# Patient Record
Sex: Female | Born: 1960 | Race: Black or African American | Hispanic: No | Marital: Married | Smoking: Never smoker
Health system: Southern US, Community
[De-identification: ages and names within clinical notes are randomized; demographics above are authoritative.]

## PROBLEM LIST (undated history)

## (undated) DIAGNOSIS — E7800 Pure hypercholesterolemia, unspecified: Secondary | ICD-10-CM

## (undated) DIAGNOSIS — F419 Anxiety disorder, unspecified: Secondary | ICD-10-CM

## (undated) DIAGNOSIS — N951 Menopausal and female climacteric states: Secondary | ICD-10-CM

## (undated) DIAGNOSIS — G43909 Migraine, unspecified, not intractable, without status migrainosus: Secondary | ICD-10-CM

## (undated) DIAGNOSIS — Z8742 Personal history of other diseases of the female genital tract: Secondary | ICD-10-CM

## (undated) DIAGNOSIS — D219 Benign neoplasm of connective and other soft tissue, unspecified: Secondary | ICD-10-CM

## (undated) HISTORY — PX: ABDOMINAL HYSTERECTOMY: SHX81

## (undated) HISTORY — DX: Pure hypercholesterolemia, unspecified: E78.00

## (undated) HISTORY — PX: LIPOMA EXCISION: SHX5283

## (undated) HISTORY — DX: Migraine, unspecified, not intractable, without status migrainosus: G43.909

## (undated) HISTORY — DX: Anxiety disorder, unspecified: F41.9

## (undated) HISTORY — PX: OTHER SURGICAL HISTORY: SHX169

## (undated) HISTORY — DX: Benign neoplasm of connective and other soft tissue, unspecified: D21.9

## (undated) HISTORY — PX: REDUCTION MAMMAPLASTY: SUR839

## (undated) HISTORY — PX: UTERINE FIBROID SURGERY: SHX826

## (undated) HISTORY — PX: MYOMECTOMY: SHX85

## (undated) HISTORY — DX: Personal history of other diseases of the female genital tract: Z87.42

## (undated) HISTORY — DX: Menopausal and female climacteric states: N95.1

---

## 1999-06-16 ENCOUNTER — Other Ambulatory Visit: Admission: RE | Admit: 1999-06-16 | Discharge: 1999-06-16 | Payer: Self-pay | Admitting: Obstetrics and Gynecology

## 1999-08-28 ENCOUNTER — Inpatient Hospital Stay (HOSPITAL_COMMUNITY): Admission: RE | Admit: 1999-08-28 | Discharge: 1999-08-30 | Payer: Self-pay | Admitting: Obstetrics and Gynecology

## 1999-08-28 ENCOUNTER — Encounter (INDEPENDENT_AMBULATORY_CARE_PROVIDER_SITE_OTHER): Payer: Self-pay

## 2000-06-14 ENCOUNTER — Other Ambulatory Visit: Admission: RE | Admit: 2000-06-14 | Discharge: 2000-06-14 | Payer: Self-pay | Admitting: *Deleted

## 2001-06-14 ENCOUNTER — Other Ambulatory Visit: Admission: RE | Admit: 2001-06-14 | Discharge: 2001-06-14 | Payer: Self-pay | Admitting: Obstetrics and Gynecology

## 2002-07-27 ENCOUNTER — Encounter: Payer: Self-pay | Admitting: Family Medicine

## 2002-07-27 ENCOUNTER — Ambulatory Visit (HOSPITAL_COMMUNITY): Admission: RE | Admit: 2002-07-27 | Discharge: 2002-07-27 | Payer: Self-pay | Admitting: Family Medicine

## 2002-08-09 ENCOUNTER — Other Ambulatory Visit: Admission: RE | Admit: 2002-08-09 | Discharge: 2002-08-09 | Payer: Self-pay | Admitting: *Deleted

## 2003-08-09 ENCOUNTER — Other Ambulatory Visit: Admission: RE | Admit: 2003-08-09 | Discharge: 2003-08-09 | Payer: Self-pay | Admitting: Obstetrics and Gynecology

## 2004-08-31 ENCOUNTER — Other Ambulatory Visit: Admission: RE | Admit: 2004-08-31 | Discharge: 2004-08-31 | Payer: Self-pay | Admitting: Obstetrics and Gynecology

## 2005-02-09 ENCOUNTER — Observation Stay (HOSPITAL_COMMUNITY): Admission: RE | Admit: 2005-02-09 | Discharge: 2005-02-10 | Payer: Self-pay | Admitting: Obstetrics and Gynecology

## 2005-02-09 ENCOUNTER — Encounter (INDEPENDENT_AMBULATORY_CARE_PROVIDER_SITE_OTHER): Payer: Self-pay | Admitting: *Deleted

## 2005-10-19 ENCOUNTER — Other Ambulatory Visit: Admission: RE | Admit: 2005-10-19 | Discharge: 2005-10-19 | Payer: Self-pay | Admitting: Obstetrics and Gynecology

## 2005-12-10 ENCOUNTER — Ambulatory Visit (HOSPITAL_COMMUNITY): Admission: RE | Admit: 2005-12-10 | Discharge: 2005-12-10 | Payer: Self-pay | Admitting: Family Medicine

## 2006-01-18 ENCOUNTER — Ambulatory Visit (HOSPITAL_COMMUNITY): Admission: RE | Admit: 2006-01-18 | Discharge: 2006-01-18 | Payer: Self-pay | Admitting: Obstetrics and Gynecology

## 2007-05-22 ENCOUNTER — Ambulatory Visit (HOSPITAL_COMMUNITY): Admission: RE | Admit: 2007-05-22 | Discharge: 2007-05-22 | Payer: Self-pay | Admitting: Obstetrics and Gynecology

## 2010-06-16 ENCOUNTER — Ambulatory Visit (HOSPITAL_COMMUNITY): Admission: RE | Admit: 2010-06-16 | Discharge: 2010-06-16 | Payer: Self-pay | Admitting: Obstetrics and Gynecology

## 2010-09-12 ENCOUNTER — Encounter: Payer: Self-pay | Admitting: Obstetrics and Gynecology

## 2011-01-08 NOTE — H&P (Signed)
[facility]  Patient:    Madison, Reyes Visit Number: [id number] MRN: [id number]          Service Type: Attending:  Janine Limbo, M.D. Dictated by:   Janine Limbo, M.D. Adm. Date:  02/26/02                           History and Physical  HISTORY OF PRESENT ILLNESS:   Ms. Madison Reyes is a 50 year old female, para I, 0-0-1, who presents for laparoscopically assisted vaginal hysterectomy.  The patient has a known history of fibroids.  She also has menorrhagia and dysmenorrhea.  The patient had an abdominal myomectomy in 2001.  She has been treated with pain medications and hormonal therapy.  They have not relieved her discomfort.  At this point she is ready to proceed with definitive therapy.  Her most recent Pap smear was within normal limits.  An endometrial biopsy was benign.  The patient had an ultrasound performed which showed again multiple fibroids.  PAST OBSTETRICAL HISTORY:     The patient has had one vaginal delivery at term.  PAST MEDICAL HISTORY:         The patient has had an anxiety disorder in the past.  MEDICATIONS:                  She takes Allegra for seasonal allergies.  DRUG ALLERGIES:               ASPIRIN irritates the patients stomach.  SOCIAL HISTORY:               The patient denies cigarette use, alcohol use, and recreational drug use.  REVIEW OF SYSTEMS:            See History of Present Illness.  FAMILY HISTORY:               The patient has a history of fibroids, as mentioned above.  The patients mother had a thyroidectomy.  Her father has had a heart attack.  PHYSICAL EXAMINATION:  VITAL SIGNS:                  Stable.  HEENT:                        Within normal limits.  CHEST:                        Clear.  HEART:                        Regular rate and rhythm.  BREAST:                       Without masses.  ABDOMEN:                      Nontender.  EXTREMITIES:                  Within normal  limits.  NEUROLOGIC:                   Grossly normal.  PELVIC:                       External genitalia normal.  Vagina normal. Cervix nontender.  Uterus is 8-10 week size and irregular.  Adnexa,  no masses. Rectovaginal examination confirms.  ASSESSMENT:                   1. Fibroid uterus.                               2. Prior myomectomy.                               3. Menorrhagia.                               4. Dysmenorrhea.  PLAN:                         The patient will proceed with laparoscopically assisted vaginal hysterectomy.  She understands the indications for her procedure and she accepts the risks of, but not limited to, anesthetic complications, bleeding, infection, and possible damage to the surrounding organs. Dictated by:   Janine Limbo, M.D. Attending:  Janine Limbo, M.D. DD:  02/25/02 TD:  02/26/02 Job: [id number]

## 2011-01-08 NOTE — Op Note (Signed)
NAMECARLEY, GLENDENNING               ACCOUNT NO.:  [id number]   MEDICAL RECORD NO.:  [id number]          PATIENT TYPE:  OBV   LOCATION:  9399                          FACILITY:  [facility]   PHYSICIAN:  Janine Limbo, M.D.DATE OF BIRTH:  Feb 05, 1961   DATE OF PROCEDURE:  02/09/2005  DATE OF DISCHARGE:                                 OPERATIVE REPORT   PREOPERATIVE DIAGNOSIS:  1.  Fibroid uterus.  2.  Dysmenorrhea.  3.  Menorrhagia.  4.  Dyspareunia.  5.  Status post myomectomy.  6.  Status post a right salpingo-oophorectomy.  7.  Vaginal cyst.   POSTOPERATIVE DIAGNOSIS:  1.  Fibroid uterus.  2.  Dysmenorrhea.  3.  Menorrhagia.  4.  Dyspareunia.  5.  Status post myomectomy.  6.  Status post a right salpingo-oophorectomy.  7.  Vaginal cyst.  8.  Pelvic adhesions.  9.  Endometriosis.   PROCEDURE:  1.  Diagnostic laparoscopy.  2.  Laparoscopic left salpingo-oophorectomy.  3.  Laparoscopic right salpingectomy.  4.  Peritoneal biopsies.  5.  Resection of endometriosis.  6.  Lysis of adhesions.  7.  Vaginal hysterectomy.  8.  Vaginal cystectomy.   SURGEON:  Janine Limbo, M.D.   FIRST ASSISTANT:  Elmira J. Adline Peals.   ANESTHETIC:  Is general.   DISPOSITION:  Ms. Knust is a 50 year old female, para 1-0-0-1, who presents  with the above-mentioned diagnosis. The patient has not had relief from her  discomfort from hormonal therapy nor nonsteroidal anti-inflammatory agents.  She is ready to proceed with surgery as mentioned above. She understands the  indications for surgical procedure and she accepts the risks of, but not  limited to, anesthetic complications, bleeding, infections, and possible  damage to surrounding organs. She understands that with the loss of her  ovaries that she may experience marked menopausal symptoms. We discussed  hormone replacement therapy as a possible treatment option. We reviewed the  risk and benefits of hormone replacement therapy and  we reviewed the  findings of the [facility]. The patient understands the  issues surrounding hormone-replacement therapy and she elects to proceed  with left oophorectomy (understanding that her right ovary has already been  removed) and that she wants to began estrogen replacement therapy.   FINDINGS:  The right ovary was surgically absent. The right fallopian tube  was only slightly edematous. The uterus was 10-week size and contained  multiple fibroids. The left fallopian tube was slightly shortened. The left  ovary appeared normal. There were adhesions present between the left ovary,  the left fallopian tube, and the posterior left uterus. There were adhesions  between the bowel and the left posterior uterus. There was a thick adhesion  between the omentum and the anterior abdominal wall. The anterior cul-de-sac  appeared normal. The posterior cul-de-sac appeared normal except for the  adhesions mentioned above. On the right anterior broad ligament, there were  two areas of hyperpigmentation and scarring. These areas were felt to be  consistent with endometriosis. The bowel otherwise appeared normal. Care was  taken throughout the operative procedure not to  damage the bowel, or the  vital structures underlying the pelvic sidewalls.   PROCEDURE:  The patient was taken to the operating room where a general  anesthetic was given. The patient's abdomen, perineum, and vagina were  prepped with multiple layers of Betadine. A Foley catheter was placed in the  bladder. A Hulka tenaculum was placed inside the uterus. The patient was  then sterilely draped. The subumbilical area was injected with 5 mL of half  percent Marcaine with epinephrine. A subumbilical incision was made and  carried sharply through the subcutaneous tissue, fascia, and the anterior  peritoneum. The Hassan cannula was sutured into place. A pneumoperitoneum  was then obtained. Two suprapubic areas were  injected with a total of 5 mL  of half percent Marcaine with epinephrine. Two small incisions were made and  two 5 mm trocars were placed into the lower abdomen under direct  visualization. Care was taken not to damage any of the vital underlying  structures. The pelvis and the abdomen were carefully inspected. Pictures  were taken of the patient's anatomy. The pelvic sidewalls were inspected and  the ureters were not felt to be in our operative areas. The adhesions were  lysed using a combination of blunt and sharp dissection. The adhesion  between the omentum and the anterior abdominal wall was first cauterized and  then it was cut. At this point we cauterized the left round ligament. The  ligament was cut. The left utero-ovarian ligament and left fallopian tube  were then cauterized and cut. The left infundibulopelvic ligament was  skeletonized. Care was taken not to damage any of the vital structures on  the left pelvic sidewall. We then used to Endoloop sutures of 0 Vicryl to  tied the left infundibulopelvic ligament. The ligament was cut and the ovary  and left fallopian tube were then placed in the posterior cul-de-sac. The  right fallopian tube was then cauterized and cut. The mesosalpinx on the  right was then cauterized and cut. The right fallopian tube was removed. As  noted previously the right ovary was surgically absent. There were two  hyperpigmented areas on the right broad ligament. The areas were then  resected using the laparoscopic scissors. Hemostasis was achieved using the  bipolar cautery. At this point we felt that we were ready to proceed with  the vaginal hysterectomy. Once the patient was then placed in a more  lithotomy position. The Hulka tenaculum was removed. The cervix was injected  with a diluted solution of Pitressin and saline. A circumferential incision  was made around the cervix and the vaginal mucosa was advanced both anteriorly and posteriorly. The  posterior cul-de-sac was entered.  Alternating from right to left the uterosacral ligaments, paracervical  tissues, parametrial tissues, and uterine arteries were clamped, cut,  sutured, and tied securely. The uterus was then inverted through the  posterior colpotomy. The remainder of the upper pedicles were then clamped  and cut. Hemostasis was achieved using figure-of-eight sutures. Care again  was taken not to damage any of the vital underlying structures. At this  point hemostasis was thought to be adequate. The sutures attached to the  uterosacral ligaments were brought out through the vaginal angles and tied  securely. The McCall culdoplasty suture was placed in the posterior cul-de-  sac incorporating the posterior peritoneum and both uterosacral ligaments. A  final check was made for hemostasis and again hemostasis was thought to be  adequate. The vaginal cuff was closed using figure-of-eight  sutures  incorporating the anterior vaginal mucosa, the anterior peritoneum,  posterior peritoneum, and the posterior vaginal mucosa. The McCall  culdoplasty suture was tied securely and the apex of the vagina was noted to  elevate into the mid pelvis. The patient tolerated this portion of her  procedure quite well. 0 Vicryl suture material used to this point. We then  injected the cyst on the left vaginal sidewall which measured approximately  3 cm in size. We injected the cyst with half percent Marcaine with  epinephrine. An incision was made in the vaginal mucosa overlying the cyst  and a combination of sharp and blunt dissection were used to dissect the  cyst from the left vaginal sidewall. Brisk bleeding was encountered.  Hemostasis was achieved using deep sutures of 0 Vicryl followed by  superficial sutures of 3-0 Vicryl. The vaginal mucosa was then closed using  a running locking suture of 3-0 Vicryl. Sponge, needle, and instrument  counts were correct at this point. The operator then  changed gown and  gloves. A pneumoperitoneum was then reestablished. The pelvis was carefully  inspected. The pelvis was irrigated. Hemostasis was confirmed. All  instruments were then removed under direct visualization. The fascia in the  subumbilical area was then closed using figure-of-eight sutures of 0 Vicryl.  The skin was closed using a subcuticular suture of 4-0 Monocryl. The  suprapubic incisions were closed using a subcuticular suture of 4-0  Monocryl. Sponge, needle, and instrument counts were correct on two  occasions. Estimated blood loss for the entire procedure was 210 mL. The  patient tolerated her procedure very well. She was awakened from her  anesthetic without difficulty. She was noted to drain clear yellow urine.  She was taken to the recovery room in stable condition.       AVS/MEDQ  D:  02/09/2005  T:  02/09/2005  Job:  [id number]

## 2011-01-08 NOTE — H&P (Signed)
Madison Reyes, Madison Reyes NO.:  [id number]   MEDICAL RECORD NO.:  [id number]          PATIENT TYPE:  AMB   LOCATION:  SDC                           [facility]ACILITY:  [facility]   PHYSICIAN:  Janine Limbo, M.D.DATE OF BIRTH:  June 19, 1961   DATE OF ADMISSION:  DATE OF DISCHARGE:                                HISTORY & PHYSICAL   HISTORY OF PRESENT ILLNESS:  Ms. Nault is a 50 year old female para 1-0-0-1  who presents for laparoscopy-assisted vaginal hysterectomy, left salpingo-  oophorectomy, and removal of a vaginal cyst. The patient has a known history  of fibroids. She had an abdominal myomectomy in 2001. At that time the  patient was noted to have filmy adhesions between the left fimbriated end of  fallopian tube and the left pelvic sidewall. The left ovary was slightly  adhered to the left posterior broad ligament. The right fallopian tube and  right ovary were noted to be surgically absent. The patient has had a prior  right salpingo-oophorectomy. The patient's most recent Pap smear was within  normal limits. She has had an endometrial biopsy that showed benign  elements. The the patient has been treated with oral contraceptives as well  as pain medication and she continues to have menorrhagia and dysmenorrhea.  The cyst in the vagina causes discomfort for her with intercourse. She does  complain of menopausal symptoms and she is currently taking Wellbutrin XL  300 milligrams. She also takes black cohosh, and other herbal medications.   OBSTETRICAL HISTORY:  The patient has had one term vaginal delivery.   PAST MEDICAL HISTORY:  The patient has a disorder that is similar to  anxiety. She has done better on Wellbutrin. Her other current medications  include multivitamins, calcium, black cohosh, B complex, vitamin D, flaxseed  oil, and Wellbutrin XL 300 milligrams.   SOCIAL HISTORY:  The patient denies cigarette use, alcohol use, and  recreational drug use.   DRUG  ALLERGIES:  ASPIRIN irritates the patient's stomach.   REVIEW OF SYSTEMS:  The patient complains of decreased libido in addition to  dyspareunia. Please see history of present illness.   FAMILY HISTORY:  The patient's mother had a thyroidectomy. The patient's  father has had a heart attack in the past.   PHYSICAL EXAMINATION:  VITAL SIGNS:  Weight is 151 pounds.  HEENT:  Within normal limits.  CHEST:  Clear.  HEART:  Regular rate and rhythm.  BREASTS:  Without masses.  ABDOMEN:  Soft and nontender.  EXTREMITIES:  Within normal limits.  NEUROLOGIC:  Grossly normal.  PELVIC:  External genitalia is normal. The vagina is normal except for a 3-4  cm cyst on the left vaginal side wall. Cervix is nontender and no lesions  are appreciated. The uterus is 10- to 12-week size and irregular. Adnexa  with no masses and rectovaginal exam confirms.   ASSESSMENT:  1.  Fibroid uterus.  2.  Dysmenorrhea.  3.  Menorrhagia.  4.  Vaginal cyst.  5.  Dyspareunia.  6.  Prior myomectomy  7.  Prior right salpingo-oophorectomy.   PLAN:  The patient  will undergo a laparoscopy-assisted vaginal hysterectomy  with left salpingo-oophorectomy. She will also have removal of the cyst in  the vagina. The patient understands the indications for her procedure as  well as the other treatment options. She accepts the risks of, but not  limited to, anesthetic complications, bleeding, infection, and possible  damage to surrounding organs. She understands that if she has her left ovary  removed, then hormone replacement therapy will be an issue for her. We have  discussed the risks and benefits of hormone replacement therapy and we have  also discussed the findings of the [facility]. After  carefully considering her options, the patient does wish to have her left  ovary removed and she wishes to begin estrogen replacement therapy.       AVS/MEDQ  D:  02/03/2005  T:  02/03/2005  Job:  [id number]

## 2011-01-08 NOTE — Discharge Summary (Signed)
[facility]  Patient:    Madison Reyes                       MRN: [id number] Adm. Date:  [id number] Disc. Date: [id number] Attending:  Leonard Schwartz Dictator:   Henreitta Leber, P.A.                           Discharge Summary  DISCHARGE DIAGNOSES:          1. Fibroid uterus.                               2. Menorrhagia.                               3. Dysmenorrhea.  PROCEDURE:                    On date of admission, the patient underwent an abdominal myomectomy.  HISTORY OF PRESENT ILLNESS:   This is a 50 year old African-American female, para 1-0-0-1, with a history of symptomatic uterine fibroids causing menorrhagia and  dysmenorrhea neither of which were relieved by oral contraceptives or NSAIDs. Please see the patients dictated history and physical examination for details.  PHYSICAL EXAMINATION:         VITAL SIGNS: Weight 167.  GENERAL: Within normal limits.  PELVIC: EGBUS was within normal limits.  Vagina is normal.  Cervix is nontender.  Uterus was 10 weeks size, irregular, and firm.  The adnexa reveal no masses.  Rectovaginal examination confirms.  HOSPITAL COURSE:              On date of admission, the patient underwent an abdominal myomectomy for the removal of multiple uterine fibroids and tolerated the procedure well.  Postoperative course was unremarkable.  Postoperative hemoglobin was 10.0, preoperative hemoglobin 12.5.  The patient quickly tolerated a regular diet and resumed bowel and bladder function by postoperative day #2 and was believed to have received the maximal benefit of her hospital stay and therefore was discharged to home.  DISCHARGE MEDICATIONS:        1. Vicodin one to two tablets every four hours as  needed for pain.                               2. Iron 325 mg one tablet three times a day for six                                  weeks.                               3. Also advised to continue  any previous medication.  FOLLOW-UP:                    The patient was advised to call [facility] to schedule an appointment within one week for staple  removal and also for a six-week postoperative examination by Janine Limbo, M.D.  DISCHARGE INSTRUCTIONS:       The patient was given a copy of [facility] postoperative instruction sheet.  She was further advised to avoid driving for two weeks, heavy lifting for four weeks, and intercourse for six weeks.  The patient was instructed to call our office with any questions or  concerns.  Final pathology revealed multiple benign leiomyomata and fibrous adhesions. DD:  10/27/99 TD:  10/28/99 Job: [id number]

## 2011-01-08 NOTE — Discharge Summary (Signed)
[name], [name]               ACCOUNT NO.:  [id number]   MEDICAL RECORD NO.:  [id number]          PATIENT TYPE:  OBV   LOCATION:  9312                          FACILITY:  [facility]   PHYSICIAN:  Janine Limbo, M.D.DATE OF BIRTH:  September 11, 1960   DATE OF ADMISSION:  02/09/2005  DATE OF DISCHARGE:  02/10/2005                                 DISCHARGE SUMMARY   DISCHARGE DIAGNOSIS:  Fibroid uterus, endometriosis, Bartholin's gland cyst,  dysmenorrhea, menorrhagia, dyspareunia, pelvic adhesions.   OPERATION:  On the day of admission the patient underwent a diagnostic  laparoscopy with a left salpingo-oophorectomy and right salpingectomy with  peritoneal biopsies and lysis of adhesions. The patient then underwent a  total vaginal hysterectomy along with a vaginal wall cystectomy tolerating  all procedures well. The patient was found to have a 12-week size uterus  with adhesion to the anterior abdominal wall. She also was observed to have  a right tube with paratubal cyst, adherent left tube and ovary, within the  posterior left side wall, cul-de-sac and right pelvic side wall there was  stigmata which was consistent with endometriosis. The patient had the left  vaginal wall cyst measuring approximately 3 cm.   HISTORY OF PRESENT ILLNESS:  Madison Reyes is a 50 year old female para 1-0-0-1  who presents for a laparoscopically-assisted vaginal hysterectomy with a  left salpingo-oophorectomy and removal of vaginal cyst due to a longstanding  history of dysmenorrhea, menorrhagia and dyspareunia. The patient is status  post myomectomy. Please see the patient's dictated history and physical  examination for details.   PREOPERATIVE PHYSICAL EXAM:  Weight is 151 pounds. General exam was within  normal limits. Pelvic examination; external genitalia is normal. Vagina is  normal except for a 3-4 cm cyst in the left vaginal sidewall. Cervix is  nontender without lesions. Uterus is 10-12 weeks size  and irregular. Adnexa  with no masses and rectovaginal exam confirms.   HOSPITAL COURSE:  On the date of admission the patient underwent  aforementioned procedures tolerating them all well. Postoperative course was  unremarkable with the patient resuming bowel and bladder function by  postoperative day #1 and was therefore deemed ready for discharge home. The  patient's postoperative hemoglobin was 10.0 (preoperative hemoglobin 12.0).   DISCHARGE MEDICATIONS:  1.  Phenergan 25 milligrams 1 tablet every 6 hours as needed for nausea  2.  Colace 100 milligram 1 tablet twice daily until bowel movements are      regular  3.  Vicodin 1-2 tablets every 4 hours as needed for severe pain  4.  Iron 325 milligrams 1 tablet twice daily for 6 weeks  5.  Estradiol patch 0.05 milligrams 1 patch weekly.   FOLLOW-UP:  The patient is scheduled for 6 weeks postoperative visit with  Dr. Stefano Gaul on March 18, 2005 of 10:30 a.m. at the new location for [employer], [address], [address].   DISCHARGE INSTRUCTIONS:  The patient was given a copy of [facility] postoperative instruction sheet. She was further advised to avoid  driving for 2 weeks, heavy lifting  for 4 weeks and intercourse for 6 weeks.  The patient's diet was without restriction.   FINAL PATHOLOGY:  Uterus and cervix: Chronic cervicitis with squamous  metaplasia, no intraepithelial lesion identified. Endometrium: Proliferative  endometrium, no hyperplasia or malignancy identified. Myometrium:  Multiple  leiomyomas. Vaginal excisional biopsy:  Findings consistent with Bartholin  gland cyst. Left ovary and fallopian tube: Follicle cyst was surface  adhesions, fallopian tube was benign. Peritoneal biopsies:  Findings  consistent with endometriosis. Right fallopian tube:  Benign fallopian tube  with surface adhesions.       EJP/MEDQ  D:  03/08/2005  T:  03/09/2005  Job:  [id number]

## 2011-01-08 NOTE — Op Note (Signed)
[facility]  Patient:    Madison Reyes                       MRN: [id number] Proc. Date: 08/28/99 Adm. Date:  [id number] Attending:  Leonard Schwartz                           Operative Report  PREOPERATIVE DIAGNOSIS:       Fibroid uterus.  Menorrhagia.  Dysmenorrhea.  POSTOPERATIVE DIAGNOSIS:      Fibroid uterus.  Menorrhagia.  Dysmenorrhea. Probable adenomyosis.  OPERATION:                    Abdominal myomectomy.  SURGEON:                      Janine Limbo, M.D.  ASSISTANT:                    Henreitta Leber, P.A.  ANESTHESIA:                   General anesthesia.  ESTIMATED BLOOD LOSS:  INDICATIONS:                  Madison Reyes is a 50 year old female, para 1-0-0-1, who presents with dysmenorrhea, and menorrhagia inspite of nonsteroidal anti-inflammatory agents and birth control pills.  She has a known fibroid uterus. She understands the indications for her surgical procedure and she accepts the risks of, but not limited to, anesthetic complications, bleeding, infections, and possible damage to the surrounding organs.  FINDINGS:                     The uterus was 10 weeks size and it contained several fibroids that were intramural.  There was a thickened area of myometrium at the  fundus of the uterus posteriorly consistent with adenomyosis.  The right ovary as surgically absent.  The left ovary was slightly scarred to the left posterior broad ligament.  The left fallopian tube appeared normal except there were some filmy  adhesions between the proximal tube and the fundus of the uterus.  The right fallopian tube appeared completely normal.  There were no lesions present in the anterior nor the posterior cul-de-sac.  DESCRIPTION OF PROCEDURE:     The patient was taken to the operating room where a general anesthesia was given.  The abdomen, perineum, and vagina were prepped with multiple layers of Betadine.  A Foley  catheter was placed in the bladder.  The patient was sterilely draped.  A low transverse incision was made in the abdomen through the prior incision and extended through the subcutaneous tissue, the fascia, and the anterior peritoneum.  An abdominal wall retractor was placed. he bowel was packed cephalad.  The uterus was elevated into the operative field. he anterior uterus was injected with a diluted solution of Pitressin and saline. n incision was made in the anterior uterus and a 2 x 2 cm fibroid was removed from the anterior lower segment.  The defect in the uterus was closed using deep sutures of 0 Vicryl.  The serosa of the uterus was closed using a baseball stitch of 3-0 Vicryl.  The fibroid on the posterior right portion of the uterus was then injected with a diluted solution of Pitressin and saline.  An incision was made and the fibroid was sharply removed.  The thickened area of myometrium consistent with adenomyosis was then sharply removed from the midposterior portion of the uterus. Three other fibroids were then removed through this same incision.  The defect n the fundus of the uterus was closed using deep sutures of 0 Vicryl followed by  baseball stitch of 3-0 Vicryl on the serosa.  Hemostasis was noted to be adequate. The filmy adhesions between the proximal left tube and the posterior uterus was  then removed.  Hemostasis again was noted to be adequate.  The pelvis was irrigated.  Interceed was placed on all of the incisions.  All instruments were  removed.  Hemostasis was felt to be adequate throughout.  The anterior peritoneum was closed using a figure-of-eight suture of 3-0 Vicryl.  The abdominal musculature and the fascia were irrigated.  Hemostasis was fine.  The fascia was closed using two running sutures of 0 Vicryl from the corners to the midline.  The subcutaneous area was irrigated.  The subcutaneous tissue was closed using a running suture f 2-0  Vicryl.  The skin was reapproximated using skin staples.  Sponge, needle, and instrument counts were correct x 2 occasions.  The estimated blood loss was 75 c. The patient tolerated her procedure well.  She was awakened from her anesthetic and taken to the recovery room in stable condition.  The specimens were sent to pathology for evaluation. DD:  08/28/99 TD:  08/29/99 Job: [id number]

## 2011-05-06 ENCOUNTER — Emergency Department (HOSPITAL_COMMUNITY)
Admission: EM | Admit: 2011-05-06 | Discharge: 2011-05-06 | Disposition: A | Discharge status: Home / Self Care | Payer: 59 | Attending: Emergency Medicine | Admitting: Emergency Medicine

## 2011-05-06 ENCOUNTER — Encounter: Payer: Self-pay | Admitting: Emergency Medicine

## 2011-05-06 ENCOUNTER — Other Ambulatory Visit: Payer: Self-pay

## 2011-05-06 ENCOUNTER — Emergency Department (HOSPITAL_COMMUNITY): Payer: 59

## 2011-05-06 DIAGNOSIS — Z9079 Acquired absence of other genital organ(s): Secondary | ICD-10-CM | POA: Insufficient documentation

## 2011-05-06 DIAGNOSIS — R42 Dizziness and giddiness: Secondary | ICD-10-CM | POA: Insufficient documentation

## 2011-05-06 DIAGNOSIS — R079 Chest pain, unspecified: Secondary | ICD-10-CM

## 2011-05-06 DIAGNOSIS — R072 Precordial pain: Secondary | ICD-10-CM | POA: Insufficient documentation

## 2011-05-06 DIAGNOSIS — R002 Palpitations: Secondary | ICD-10-CM | POA: Insufficient documentation

## 2011-05-06 DIAGNOSIS — R51 Headache: Secondary | ICD-10-CM | POA: Insufficient documentation

## 2011-05-06 LAB — CBC
HCT: 37.3 % (ref 36.0–46.0)
MCV: 94.2 fL (ref 78.0–100.0)
RBC: 3.96 MIL/uL (ref 3.87–5.11)
RDW: 12.6 % (ref 11.5–15.5)
WBC: 6.4 10*3/uL (ref 4.0–10.5)

## 2011-05-06 LAB — COMPREHENSIVE METABOLIC PANEL
BUN: 13 mg/dL (ref 6–23)
CO2: 25 mEq/L (ref 19–32)
Chloride: 104 mEq/L (ref 96–112)
Creatinine, Ser: 0.7 mg/dL (ref 0.50–1.10)
GFR calc Af Amer: >60 mL/min (ref >60)
GFR calc non Af Amer: >60 mL/min (ref >60)
Total Bilirubin: 1 mg/dL (ref 0.3–1.2)

## 2011-05-06 LAB — CARDIAC PANEL(CRET KIN+CKTOT+MB+TROPI)
CK, MB: 2.1 ng/mL (ref 0.3–4.0)
Total CK: 40 U/L (ref 7–177)

## 2011-05-06 MED ORDER — SODIUM CHLORIDE 0.9 % IJ SOLN: 3.0000 mL | INTRAMUSCULAR | Status: DC | PRN

## 2011-05-06 NOTE — ED Notes (Signed)
Pt c/o headaches, chest discomfort and dizziness x 2 weeks. Pt states she just doesn't feel like herself.

## 2011-05-06 NOTE — ED Notes (Signed)
Patient given tray of food, per request and EDPs approval.

## 2011-05-06 NOTE — ED Provider Notes (Signed)
History   Scribed for Madison Quarry, MD, the patient was seen in room APA07/APA07. This chart was scribed by Clarita Crane. This patient's care was started at 11:46AM.   CSN: [id number] Arrival date & time: 05/06/2011 11:09 AM   Chief Complaint  Patient presents with  . Shortness of Breath  . Chest Pain   HPI [name] is a 50 y.o. female who presents to the Emergency Department complaining of non-radiating constant substernal chest pain described as pressure with associated palpitations, HA and mild dizziness onset 2 weeks ago and persistent since. Patient notes chest pressure is aggravated by exertion and relieved by nothing. Denies n/v, cough, fever, chills. Patient with no pertinent medical history. Notes family h/o of father with MI before age 16 and COPD with both parents Father with h/o MI, COPD before age 60  HPI ELEMENTS: Location: substernal chest Onset: 2 weeks ago Duration: persistent since onset  Timing: constant  Quality: pressure   Modifying factors: aggravated by exertion. Relieved by nothing.   Context:  as above  Associated symptoms: +HA, dizziness, palpitations Denies n/v, cough, fever, chills.  PAST MEDICAL HISTORY:  History reviewed. No pertinent past medical history.  PAST SURGICAL HISTORY:  Past Surgical History  Procedure Date  . Myomectomy   . Abdominal hysterectomy     MEDICATIONS:  Previous Medications   BUPROPION (WELLBUTRIN XL) 300 MG 24 HR TABLET    Take 300 mg by mouth daily.     ESTRADIOL ACETATE 0.05 MG/24HR RING    Place 1 each vaginally every 21 ( twenty-one) days.       ALLERGIES:  Allergies as of 05/06/2011 - Review Complete 05/06/2011  Allergen Reaction Noted  . Aspirin  05/06/2011     FAMILY HISTORY:  History reviewed. No pertinent family history.   SOCIAL HISTORY: History   Social History  . Marital Status: Married    Spouse Name: N/A    Number of Children: N/A  . Years of Education: N/A   Social History Main  Topics  . Smoking status: Never Smoker   . Smokeless tobacco: None  . Alcohol Use: No  . Drug Use: No  . Sexually Active:    Other Topics Concern  . None   Social History Narrative  . None     Review of Systems 10 Systems reviewed and are negative for acute change except as noted in the HPI.  Allergies  Aspirin  Home Medications   Current Outpatient Rx  Name Route Sig Dispense Refill  . BUPROPION HCL (XL) 300 MG PO TB24 Oral Take 300 mg by mouth daily.      Marland Kitchen ESTRADIOL ACETATE 0.05 MG/24HR VA RING Vaginal Place 1 each vaginally every 21 ( twenty-one) days.        Physical Exam    BP 132/82  Pulse 86  Temp(Src) 98.6 F (37 C) (Oral)  Resp 21  Ht 5\' 7"  (1.702 m)  Wt 160 lb (72.576 kg)  BMI 25.06 kg/m2  SpO2 100%  Physical Exam  Nursing note and vitals reviewed. Constitutional: She is oriented to person, place, and time. She appears well-developed and well-nourished. No distress.  HENT:  Head: Normocephalic and atraumatic.  Eyes: Conjunctivae are normal. Pupils are equal, round, and reactive to light.  Neck: Neck supple.  Cardiovascular: Normal rate, regular rhythm and normal pulses.  Exam reveals no gallop and no friction rub.   No murmur heard. Pulmonary/Chest: Effort normal and breath sounds normal. She has no wheezes.  Abdominal: Soft. Bowel sounds are normal. She exhibits no distension. There is no tenderness.  Musculoskeletal: Normal range of motion. She exhibits no edema.  Neurological: She is alert and oriented to person, place, and time. No sensory deficit.  Skin: Skin is warm and dry.  Psychiatric: She has a normal mood and affect. Her behavior is normal.    ED Course  Procedures  OTHER DATA REVIEWED: Nursing notes, vital signs, and past medical records reviewed. Lab results reviewed and considered Imaging results reviewed and considered  DIAGNOSTIC STUDIES: Oxygen Saturation is 98% on room air, normal by my interpretation.    LABS /  RADIOLOGY: Results for orders placed during the hospital encounter of 05/06/11  CBC      Component Value Range   WBC 6.4  4.0 - 10.5 (K/uL)   RBC 3.96  3.87 - 5.11 (MIL/uL)   Hemoglobin 13.0  12.0 - 15.0 (g/dL)   HCT 16.1  09.6 - 04.5 (%)   MCV 94.2  78.0 - 100.0 (fL)   MCH 32.8  26.0 - 34.0 (pg)   MCHC 34.9  30.0 - 36.0 (g/dL)   RDW 40.9  81.1 - 91.4 (%)   Platelets 230  150 - 400 (K/uL)  COMPREHENSIVE METABOLIC PANEL      Component Value Range   Sodium 139  135 - 145 (mEq/L)   Potassium 3.5  3.5 - 5.1 (mEq/L)   Chloride 104  96 - 112 (mEq/L)   CO2 25  19 - 32 (mEq/L)   Glucose, Bld 87  70 - 99 (mg/dL)   BUN 13  6 - 23 (mg/dL)   Creatinine, Ser 7.82  0.50 - 1.10 (mg/dL)   Calcium 95.6  8.4 - 10.5 (mg/dL)   Total Protein 7.8  6.0 - 8.3 (g/dL)   Albumin 4.5  3.5 - 5.2 (g/dL)   AST 20  0 - 37 (U/L)   ALT 18  0 - 35 (U/L)   Alkaline Phosphatase 50  39 - 117 (U/L)   Total Bilirubin 1.0  0.3 - 1.2 (mg/dL)   GFR calc non Af Amer >60  >60 (mL/min)   GFR calc Af Amer >60  >60 (mL/min)  CARDIAC PANEL(CRET KIN+CKTOT+MB+TROPI)      Component Value Range   Total CK 40  7 - 177 (U/L)   CK, MB 2.1  0.3 - 4.0 (ng/mL)   Troponin I <0.30  <0.30 (ng/mL)   Relative Index RELATIVE INDEX IS INVALID  0.0 - 2.5   POCT I-STAT TROPONIN I      Component Value Range   Troponin i, poc 0.00  0.00 - 0.08 (ng/mL)   Comment 3           D-DIMER, QUANTITATIVE      Component Value Range   D-Dimer, Quant 0.27  0.00 - 0.48 (ug/mL-FEU)   Dg Chest 2 View  05/06/2011  *RADIOLOGY REPORT*  Clinical Data: Chest pain with heaviness since the end of this. Increasing in severity.  Some shortness of breath  CHEST - 2 VIEW  Comparison: None.  Findings: Heart and mediastinal contours are within normal limits. The lung fields appear clear with no signs of focal infiltrate or congestive failure.  No pleural fluid or peribronchial cuffing is seen.  Bony structures demonstrate mild degenerative osteophytosis of the upper  thoracic spine and are otherwise intact.  IMPRESSION: No worrisome focal or acute cardiopulmonary abnormality noted.  Original Report Authenticated By: Bertha Stakes, M.D.    ED COURSE /  COORDINATION OF CARE: Orders Placed This Encounter  Procedures  . DG Chest 2 View  . CBC  . Comprehensive metabolic panel  . Cardiac panel(cret kin+cktot+mb+tropi)  . D-dimer, quantitative  . Height and weight  . Cardiac monitoring  . Pulse oximetry, continuous  . POCT i-Stat troponin I  . ED EKG     MDM:  Date: 05/06/2011  Rate: 69  Rhythm: normal sinus rhythm  QRS Axis: normal  Intervals: normal  ST/T Wave abnormalities: normal  Conduction Disutrbances:none  Narrative Interpretation:   Old EKG Reviewed: none available  96  PLAN: Discharge Home The patient is to return the emergency department if there is any worsening of symptoms. I have reviewed the discharge instructions with the patient/family  CONDITION ON DISCHARGE: Stable.    MEDICATIONS GIVEN IN THE E.D.  Medications  buPROPion (WELLBUTRIN XL) 300 MG 24 hr tablet (not administered)  Estradiol Acetate 0.05 MG/24HR RING (not administered)  sodium chloride 0.9 % injection 3 mL (not administered)      I personally performed the services described in this documentation, which was scribed in my presence. The recorded information has been reviewed and considered. Madison Quarry, MD        Madison Quarry, MD 05/06/11 [phone]

## 2011-11-19 ENCOUNTER — Other Ambulatory Visit: Payer: Self-pay | Admitting: Family Medicine

## 2011-11-19 DIAGNOSIS — E010 Iodine-deficiency related diffuse (endemic) goiter: Secondary | ICD-10-CM

## 2011-11-24 ENCOUNTER — Ambulatory Visit
Admission: RE | Admit: 2011-11-24 | Discharge: 2011-11-24 | Disposition: A | Discharge status: Home / Self Care | Payer: BC Managed Care – PPO | Source: Ambulatory Visit | Attending: Family Medicine | Admitting: Family Medicine

## 2011-11-24 DIAGNOSIS — E010 Iodine-deficiency related diffuse (endemic) goiter: Secondary | ICD-10-CM

## 2012-04-07 ENCOUNTER — Ambulatory Visit
Admission: RE | Admit: 2012-04-07 | Discharge: 2012-04-07 | Disposition: A | Discharge status: Home / Self Care | Payer: BC Managed Care – PPO | Source: Ambulatory Visit | Attending: Plastic Surgery | Admitting: Plastic Surgery

## 2012-04-07 ENCOUNTER — Other Ambulatory Visit: Payer: Self-pay | Admitting: Plastic Surgery

## 2012-04-07 DIAGNOSIS — Z1231 Encounter for screening mammogram for malignant neoplasm of breast: Secondary | ICD-10-CM

## 2012-04-10 ENCOUNTER — Encounter: Payer: Self-pay | Admitting: Obstetrics and Gynecology

## 2012-05-01 ENCOUNTER — Telehealth: Payer: Self-pay | Admitting: Obstetrics and Gynecology

## 2012-05-01 MED ORDER — ESTRADIOL 0.05 MG/24HR TD PTTW: 1.0000 | MEDICATED_PATCH | TRANSDERMAL | Status: DC

## 2012-05-01 NOTE — Telephone Encounter (Signed)
Tc from pt per ep recs. Pt opts to try Minivelle. Rx e-pres to pharm on file. Pt agrees.

## 2012-05-01 NOTE — Telephone Encounter (Signed)
Lm on vm to cb per telephone call.  

## 2012-05-01 NOTE — Telephone Encounter (Signed)
Patient on Femring 0.05 mg reports having "lost" her ring and since is was shortly after her refill she knows that her insurance will not pay to have it replaced.  She requests something to use in the interim.  Patient may be given a prescription for a 3 month supply of Minivelle 0.05 mg Patch  or may try samples of Elestrin Gel: 1 pump to upper outer shoulder/arm daily.  (direct to [email or web address] website for more information on application.  Arella Blinder,PA-C

## 2012-05-01 NOTE — Telephone Encounter (Signed)
Tc from pt per telephone call. Informed pt rx for Femring e-pres to pharm on file. Needs AEX. Pt declines to sched AEX at this time. Pt informed insurance co will not pay for this Femring rf due to pt receiving rf on 03/28/12; pt lost Femring. Pt opts not pay out of pocket cost due to increased cost. Pt states,"im afraid I wont be able to make it without my ring". Pt would like an alternative given as a sample if available(ie patch, pills) that would be somewhat equiv to Femring. Will consult with provider per recs. Pt agrees.

## 2012-05-01 NOTE — Telephone Encounter (Signed)
Lm on vm to cb per EP recs.  

## 2012-05-01 NOTE — Telephone Encounter (Signed)
TRIAGE/RX REQ °

## 2012-05-02 ENCOUNTER — Other Ambulatory Visit: Payer: Self-pay

## 2012-05-02 MED ORDER — ESTRADIOL 0.05 MG/24HR TD PTTW: 1.0000 | MEDICATED_PATCH | TRANSDERMAL | Status: DC

## 2012-06-05 ENCOUNTER — Ambulatory Visit (INDEPENDENT_AMBULATORY_CARE_PROVIDER_SITE_OTHER): Payer: BC Managed Care – PPO | Admitting: Obstetrics and Gynecology

## 2012-06-05 ENCOUNTER — Encounter: Payer: Self-pay | Admitting: Obstetrics and Gynecology

## 2012-06-05 VITALS — BP 126/68 | HR 76 | Ht 66.25 in | Wt 176.0 lb

## 2012-06-05 DIAGNOSIS — Z01419 Encounter for gynecological examination (general) (routine) without abnormal findings: Secondary | ICD-10-CM

## 2012-06-05 DIAGNOSIS — N951 Menopausal and female climacteric states: Secondary | ICD-10-CM

## 2012-06-05 MED ORDER — ESTRADIOL 0.05 MG/24HR TD PTTW: 1.0000 | MEDICATED_PATCH | TRANSDERMAL | Status: DC

## 2012-06-05 NOTE — Progress Notes (Signed)
Subjective:    Madison Reyes is a 51 y.o. female G1P1 who presents for annual exam. The patient complains of increased weight and menopausal symptoms.  She does well on the estrogen patch.  She no longer needs estrogen ring for the vagina.  She is doing well on her Wellbutrin. She continues to have constipation.  She has pain in her upper back and shoulders from her heavy breasts.  She was to have a reduction mammoplasty.  She has some numbness in her right shoulder. She has had a hysterectomy.  The following portions of the patient's history were reviewed and updated as appropriate: allergies, current medications, past family history, past medical history, past social history, past surgical history and problem list.  Review of Systems Pertinent items are noted in HPI. Gastrointestinal:No change in bowel habits, no abdominal pain, no rectal bleeding Genitourinary:negative for dysuria, frequency, hematuria, nocturia and urinary incontinence    Objective:     BP 126/68  Pulse 76  Ht 5' 6.25" (1.683 m)  Wt 176 lb (79.833 kg)  BMI 28.19 kg/m2  Weight:  Wt Readings from Last 1 Encounters:  06/05/12 176 lb (79.833 kg)     BMI: Body mass index is 28.19 kg/(m^2). General Appearance: Alert, appropriate appearance for age. No acute distress HEENT: Grossly normal Neck / Thyroid: Supple, no masses, nodes or enlargement Lungs: clear to auscultation bilaterally Back: No CVA tenderness Breast Exam: No masses or nodes.No dimpling, nipple retraction or discharge. Cardiovascular: Regular rate and rhythm. S1, S2, no murmur Gastrointestinal: Soft, non-tender, no masses or organomegaly  ++++++++++++++++++++++++++++++++++++++++++++++++++++++++  Pelvic Exam: External genitalia: normal general appearance Vaginal: normal without tenderness, induration or masses and relaxation noted Cervix: absent Adnexa: normal bimanual exam Uterus: absent Rectovaginal: normal rectal, no  masses  ++++++++++++++++++++++++++++++++++++++++++++++++++++++++  Lymphatic Exam: Non-palpable nodes in neck, clavicular, axillary, or inguinal regions  Psychiatric: Alert and oriented, appropriate affect.@OBJECTIVEEN @      Assessment:    Hormone replacement therapy Menopause constipation   Large heavy breasts  Depression  Menopausal symptoms  Urinary incontinence  Overweight or obese: Yes  Pelvic relaxation: Yes  Menopausal symptoms: Yes. Severe: Yes.   Plan:    Mammogram.   Vivelle Dot 0.05 mg per 24 hours one patch each week  The patient will speak to a surgeon about a breast reduction.  Follow-up:  for annual exam  Kegel exercises discussed: Yes.  Proper diet and regular exercise were reviewed.  Annual mammograms recommended starting at age 21. Proper breast care was discussed.  Screening colonoscopy is recommended beginning at age 75.  Regular health maintenance was reviewed.  Sleep hygiene was discussed.  The patient needs her flu shot and her tetanus booster.  Adequate calcium and vitamin D intake was emphasized.  Leonard Schwartz M.D.   Regular Periods: no Mammogram: yes  Monthly Breast Ex.: yes sometimes Exercise: yes walking  Tetanus < 10 years: no Seatbelts: yes  NI. Bladder Functn.: no overactive Abuse at home: no  Daily BM's: no constipation Stressful Work: no  Healthy Diet: yes Sigmoid-Colonoscopy: more than 5 yrs ago  Calcium: yes Medical problems this year: none   LAST PAP: 12/30/08  Contraception: hysterectomy  Mammogram:  04/10/12 wnl  PCP: [facility]  PMH: none  FMH: none  Last Bone Scan: n/a

## 2012-07-11 ENCOUNTER — Encounter: Payer: Self-pay | Admitting: Obstetrics and Gynecology

## 2012-07-11 ENCOUNTER — Telehealth: Payer: Self-pay | Admitting: Obstetrics and Gynecology

## 2012-07-11 ENCOUNTER — Ambulatory Visit (INDEPENDENT_AMBULATORY_CARE_PROVIDER_SITE_OTHER): Payer: BC Managed Care – PPO | Admitting: Obstetrics and Gynecology

## 2012-07-11 VITALS — BP 114/70 | Temp 98.9°F | Wt 175.0 lb

## 2012-07-11 DIAGNOSIS — A499 Bacterial infection, unspecified: Secondary | ICD-10-CM

## 2012-07-11 DIAGNOSIS — B9689 Other specified bacterial agents as the cause of diseases classified elsewhere: Secondary | ICD-10-CM

## 2012-07-11 DIAGNOSIS — R350 Frequency of micturition: Secondary | ICD-10-CM

## 2012-07-11 DIAGNOSIS — N760 Acute vaginitis: Secondary | ICD-10-CM

## 2012-07-11 LAB — POCT URINALYSIS DIPSTICK
Ketones, UA: NEGATIVE
Spec Grav, UA: 1.01
Urobilinogen, UA: NEGATIVE

## 2012-07-11 LAB — POCT WET PREP (WET MOUNT): pH: 5

## 2012-07-11 MED ORDER — METRONIDAZOLE 0.75 % VA GEL: 1.0000 | Freq: Every day | VAGINAL | Status: DC

## 2012-07-11 NOTE — Progress Notes (Signed)
Color: clear Odor: yes Itching:no vaginal irritation Thin:yes Thick:no Fever:no Dyspareunia:no Hx PID:no HX STD:no Pelvic Pain:yes Desires Gc/CT:no Desires HIV,RPR,HbsAG:no

## 2012-07-11 NOTE — Patient Instructions (Signed)
Bacterial Vaginosis Bacterial vaginosis is an infection of the vagina. A healthy vagina has many kinds of good germs (bacteria). Sometimes the number of good germs can change. This allows bad germs to move in and cause an infection. You may be given medicine (antibiotics) to treat the infection. Or, you may not need treatment at all. HOME CARE  Take your medicine as told. Finish them even if you start to feel better.  Do not have sex until you finish your medicine.  Do not douche GET HELP RIGHT AWAY IF:  You do not get better after 3 days of treatment.  You have grey fluid (discharge) coming from your vagina.  You have pain.  You have a temperature of 102 F (38.9 C) or higher. MAKE SURE YOU:   Understand these instructions.  Will watch your condition.  Will get help right away if you are not doing well or get worse. Document Released: 05/18/2008 Document Revised: 11/01/2011 Document Reviewed: 05/18/2008 [facility] Patient Information 2013 [place], [place].  Avoid: - excess soap on genital area (consider using plain oatmeal soap) - use of powder or sprays in genital area - douching - wearing underwear to bed (except with menses) - using more than is directed detergent when washing clothes - tight fitting garments around genital area - excess sugar intake

## 2012-07-11 NOTE — Progress Notes (Signed)
51 YO complaining of vaginitis and urinary tract symptoms for the past several weeks. Has noticed an odor and urinary frequency.  Denies fever, dysuria, hematuria or pelvic pain.  O: Pelvic: EGBUS- wnl, vagina-normal, uterus/cervix-surgically absent  Wet Prep: pH-5.5  whiff-positive, many clue cells U/A-negative  A: Bacterial Vaginosis  P: Metrogel Vaginal  #1 tube  1 appl. pv qd x 5 days no refills       Perineal hygiene      RTO-as scheduled or prn  Jerrion Tabbert, PA-C

## 2012-11-21 ENCOUNTER — Telehealth: Payer: Self-pay | Admitting: Family Medicine

## 2012-11-21 NOTE — Telephone Encounter (Signed)
Nurse: 2:09PM Patient: Madison Reyes  CB: [phone] home  Message: Right shoulder is not getting any better. Patient wants to know if a MRI is warranted and/or is an office visit will be needed. On a scale (1-10) her pain level is at 9.

## 2012-11-21 NOTE — Telephone Encounter (Signed)
Patient advised to schedule office visit to discuss.

## 2012-11-22 ENCOUNTER — Encounter: Payer: Self-pay | Admitting: *Deleted

## 2012-11-24 ENCOUNTER — Ambulatory Visit (INDEPENDENT_AMBULATORY_CARE_PROVIDER_SITE_OTHER): Payer: BC Managed Care – PPO | Admitting: Family Medicine

## 2012-11-24 ENCOUNTER — Encounter: Payer: Self-pay | Admitting: Family Medicine

## 2012-11-24 VITALS — BP 122/86 | HR 80 | Ht 66.75 in | Wt 166.8 lb

## 2012-11-24 DIAGNOSIS — M542 Cervicalgia: Secondary | ICD-10-CM

## 2012-11-24 MED ORDER — TRAMADOL HCL 50 MG PO TABS: 50.0000 mg | ORAL_TABLET | Freq: Four times a day (QID) | ORAL | Status: DC | PRN

## 2012-11-24 NOTE — Patient Instructions (Signed)
Do xrays  Notify us if worse

## 2012-11-24 NOTE — Progress Notes (Signed)
  Subjective:    Patient ID: Madison Reyes, female    DOB: 13-Aug-1961, 52 y.o.   MRN: [id number]  Shoulder Pain  The pain is present in the neck, back and right shoulder. This is a recurrent problem. The current episode started more than 1 month ago. There has been no history of extremity trauma. The problem occurs constantly. The problem has been gradually worsening. The quality of the pain is described as aching. The pain is at a severity of 10/10. The pain is severe. Associated symptoms include numbness and tingling. The symptoms are aggravated by activity. She has tried NSAIDS and acetaminophen for the symptoms. The treatment provided moderate relief.  Back Pain This is a recurrent problem. The current episode started more than 1 month ago. The problem occurs constantly. The problem has been gradually worsening since onset. The pain is present in the lumbar spine. The quality of the pain is described as aching. The pain radiates to the right foot. The pain is at a severity of 10/10. The pain is severe. The pain is the same all the time. Associated symptoms include numbness and tingling. She has tried NSAIDs for the symptoms. The treatment provided moderate relief.      Review of Systems  Musculoskeletal: Positive for back pain.  Neurological: Positive for tingling and numbness.       Objective:   Physical Exam On exam she has tenderness in the right trapezius with subjective discomfort down the right arm strength is good reflexes gait is good. Her lungs are clear hearts regular neck no masses she also has subjective sciatica from the lower back down the right side of the leg negative straight leg raise reflexes good strength is good       Assessment & Plan:  [phone] #1 cervical nerve impingement-to get plain cervical films. Await the results of this. At this point in time there is no weakness I don't recommend MRI she may continue physical therapy but if she does not see significant  improvement over the next several weeks she ought to consider MRI certainly if she gets weakness she should immediately proceed to MRI  #2 lumbar discomfort with sciatica --- hopefully this will gradually get better over the next 6-8 weeks if weakness loss of bowel or bladder control she is immediately to connect with [place] and we would proceed to do an MRI.

## 2012-12-04 ENCOUNTER — Ambulatory Visit (HOSPITAL_COMMUNITY)
Admission: RE | Admit: 2012-12-04 | Discharge: 2012-12-04 | Disposition: A | Discharge status: Home / Self Care | Payer: BC Managed Care – PPO | Source: Ambulatory Visit | Attending: Family Medicine | Admitting: Family Medicine

## 2012-12-04 DIAGNOSIS — M79609 Pain in unspecified limb: Secondary | ICD-10-CM | POA: Insufficient documentation

## 2012-12-04 DIAGNOSIS — M542 Cervicalgia: Secondary | ICD-10-CM | POA: Insufficient documentation

## 2012-12-04 DIAGNOSIS — M5030 Other cervical disc degeneration, unspecified cervical region: Secondary | ICD-10-CM | POA: Insufficient documentation

## 2013-03-14 ENCOUNTER — Telehealth: Payer: Self-pay | Admitting: Family Medicine

## 2013-03-14 NOTE — Telephone Encounter (Signed)
Not referral

## 2013-03-14 NOTE — Telephone Encounter (Signed)
[facility] in [place] needs a continuation of therapy sent to there office for continuation of therapy

## 2013-03-15 NOTE — Telephone Encounter (Signed)
Order faxed to [facility]. Left message on answering machine notifying patient.

## 2013-03-15 NOTE — Telephone Encounter (Signed)
Fine, give ok for up to 4 more weeks

## 2013-03-30 ENCOUNTER — Ambulatory Visit (INDEPENDENT_AMBULATORY_CARE_PROVIDER_SITE_OTHER): Payer: BC Managed Care – PPO | Admitting: Nurse Practitioner

## 2013-03-30 ENCOUNTER — Encounter: Payer: Self-pay | Admitting: Nurse Practitioner

## 2013-03-30 VITALS — BP 116/80 | Ht 66.0 in | Wt 161.0 lb

## 2013-03-30 DIAGNOSIS — R5383 Other fatigue: Secondary | ICD-10-CM

## 2013-03-30 DIAGNOSIS — R5381 Other malaise: Secondary | ICD-10-CM

## 2013-03-30 DIAGNOSIS — F418 Other specified anxiety disorders: Secondary | ICD-10-CM

## 2013-03-30 DIAGNOSIS — Z0000 Encounter for general adult medical examination without abnormal findings: Secondary | ICD-10-CM

## 2013-03-30 DIAGNOSIS — F341 Dysthymic disorder: Secondary | ICD-10-CM

## 2013-03-30 MED ORDER — CLONAZEPAM 0.5 MG PO TABS: ORAL_TABLET | ORAL | Status: DC

## 2013-03-30 NOTE — Patient Instructions (Signed)
Luvena 2-3 x per week for vaginal health 

## 2013-04-02 ENCOUNTER — Encounter: Payer: Self-pay | Admitting: Nurse Practitioner

## 2013-04-02 DIAGNOSIS — F418 Other specified anxiety disorders: Secondary | ICD-10-CM | POA: Insufficient documentation

## 2013-04-02 NOTE — Assessment & Plan Note (Signed)
Continue same dose of Wellbutrin. Add low-dose Klonopin for anxiety attacks.

## 2013-04-02 NOTE — Progress Notes (Signed)
Subjective:  Presents with complaints of fatigue sluggishness and hypersomnia but been going on for a few months. Began about the time her son completed high school. He'll be leaving for college in the next few weeks. This is her only child. Has had some difficulty adjusting to changes in her life. Early morning awakenings 3-4 days out of the week.. Also very stressful job working with trouble [employer]. Having mild spells of anxiety at times particularly due to her job. Plans to change jobs in the near future. Overall healthy diet. Trying to get some exercise. Denies any suicidal or homicidal thoughts or ideation. To complicate matters patient may be having some hormonal issues, had abdominal hysterectomy and bilateral oophorectomy in 2006. Gets regular preventive health physicals.  Objective:   BP 116/80  Ht 5\' 6"  (1.676 m)  Wt 161 lb (73.029 kg)  BMI 26 kg/m2  NAD. Alert, oriented. Cheerful affect. Lungs clear. Heart regular rate rhythm.  Assessment:Depression with anxiety  Routine general medical examination at a health care facility - Plan: CBC with Differential, Basic metabolic panel, Hepatic function panel, Lipid panel, CBC with Differential, Basic metabolic panel, Hepatic function panel, Lipid panel  Other malaise and fatigue - Plan: CBC with Differential, Basic metabolic panel, Hepatic function panel, TSH, Vitamin D 25 hydroxy, CBC with Differential, Basic metabolic panel, Hepatic function panel, TSH, Vitamin D 25 hydroxy  Plan: Feel depression symptoms are coming more from her son leaving home; empty nest syndrome. Anxiety is being exacerbated by her job. Encouraged patient to continue looking for another position. Because of anxiety, will keep Wellbutrin at the same amount at this time. Given low dose Klonopin to take sparingly for panic attacks. Call back in 2-3 weeks if no improvement. Also discussed importance of finding other ways to fulfill her life due to significant changes ie,  "reinventing herself".

## 2013-04-03 LAB — HEPATIC FUNCTION PANEL
ALT: 16 U/L (ref 0–35)
AST: 17 U/L (ref 0–37)
Alkaline Phosphatase: 45 U/L (ref 39–117)
Indirect Bilirubin: 0.8 mg/dL (ref 0.0–0.9)
Total Protein: 7 g/dL (ref 6.0–8.3)

## 2013-04-03 LAB — CBC WITH DIFFERENTIAL/PLATELET
Eosinophils Relative: 3 % (ref 0–5)
Lymphocytes Relative: 44 % (ref 12–46)
Lymphs Abs: 2.5 10*3/uL (ref 0.7–4.0)
MCV: 94.2 fL (ref 78.0–100.0)
Neutro Abs: 2.8 10*3/uL (ref 1.7–7.7)
Platelets: 247 10*3/uL (ref 150–400)
RBC: 3.96 MIL/uL (ref 3.87–5.11)
WBC: 5.8 10*3/uL (ref 4.0–10.5)

## 2013-04-03 LAB — BASIC METABOLIC PANEL
CO2: 27 mEq/L (ref 19–32)
Chloride: 105 mEq/L (ref 96–112)
Creat: 0.85 mg/dL (ref 0.50–1.10)
Potassium: 3.8 mEq/L (ref 3.5–5.3)
Sodium: 140 mEq/L (ref 135–145)

## 2013-04-03 LAB — LIPID PANEL
LDL Cholesterol: 168 mg/dL — ABNORMAL HIGH (ref 0–99)
VLDL: 24 mg/dL (ref 0–40)

## 2013-04-03 LAB — TSH: TSH: 2.559 u[IU]/mL (ref 0.350–4.500)

## 2013-04-04 LAB — VITAMIN D 25 HYDROXY (VIT D DEFICIENCY, FRACTURES): Vit D, 25-Hydroxy: 48 ng/mL (ref 30–89)

## 2013-04-05 ENCOUNTER — Encounter: Payer: Self-pay | Admitting: Nurse Practitioner

## 2013-04-16 ENCOUNTER — Other Ambulatory Visit: Payer: Self-pay | Admitting: Family Medicine

## 2013-05-02 ENCOUNTER — Telehealth: Payer: Self-pay | Admitting: Family Medicine

## 2013-05-02 NOTE — Telephone Encounter (Signed)
Would like someone to call her regarding her lab work.  Please call Patient. Thanks

## 2013-06-15 ENCOUNTER — Other Ambulatory Visit: Payer: Self-pay | Admitting: Family Medicine

## 2013-06-18 ENCOUNTER — Other Ambulatory Visit: Payer: Self-pay

## 2013-06-18 DIAGNOSIS — Z1231 Encounter for screening mammogram for malignant neoplasm of breast: Secondary | ICD-10-CM

## 2013-06-22 ENCOUNTER — Ambulatory Visit
Admission: RE | Admit: 2013-06-22 | Discharge: 2013-06-22 | Disposition: A | Discharge status: Home / Self Care | Payer: BC Managed Care – PPO | Source: Ambulatory Visit

## 2013-06-22 DIAGNOSIS — Z1231 Encounter for screening mammogram for malignant neoplasm of breast: Secondary | ICD-10-CM

## 2013-07-13 ENCOUNTER — Ambulatory Visit: Payer: BC Managed Care – PPO

## 2013-08-23 HISTORY — PX: BACK SURGERY: SHX140

## 2013-10-18 ENCOUNTER — Other Ambulatory Visit: Payer: Self-pay | Admitting: Family Medicine

## 2013-10-25 ENCOUNTER — Other Ambulatory Visit: Payer: Self-pay | Admitting: Family Medicine

## 2013-10-25 ENCOUNTER — Telehealth: Payer: Self-pay | Admitting: Family Medicine

## 2013-10-25 MED ORDER — PREDNISONE 20 MG PO TABS: ORAL_TABLET | ORAL | Status: AC

## 2013-10-25 NOTE — Telephone Encounter (Signed)
May do prednisone taper. This was sent in to the pharmacy today informed the patient she will pick it up on Friday thank you if she has ongoing troubles she ought to followup

## 2013-10-25 NOTE — Telephone Encounter (Signed)
Pt seen by Dr Lovena Le today, having some sciatic issues on her left side right now. Wants to know if you can all in a prednisone pack or what ever you would recommend  For this. She has been trying tramadol already an its just not working. Dr Lovena Le suggested Her asking for the steroid.   [place]

## 2013-10-29 ENCOUNTER — Telehealth: Payer: Self-pay | Admitting: Family Medicine

## 2013-10-29 MED ORDER — HYDROCODONE-ACETAMINOPHEN 5-325 MG PO TABS: 1.0000 | ORAL_TABLET | ORAL | Status: DC | PRN

## 2013-10-29 NOTE — Telephone Encounter (Signed)
Script ready for pick up at front desk.

## 2013-10-29 NOTE — Telephone Encounter (Signed)
Patient states she would like the hydrocodone written please. Her husband will come by and pick up the script.

## 2013-10-29 NOTE — Telephone Encounter (Signed)
On 4th day of prednisone, 600 mg Ibuprofen not helping, requesting stronger pain meds, if can please call in to [facility], has appt here with Hoyle Sauer 10/31/13, please call pt when done [phone]

## 2013-10-29 NOTE — Telephone Encounter (Signed)
Hydrocodone 5 mg/325 mg, #30, one every 4 hours when necessary severe pain, cautioned drowsiness.

## 2013-10-29 NOTE — Telephone Encounter (Signed)
Nurse to call-essentially her options are hydrocodone for pain or tramadol. Tramadol can be sent rectally to the pharmacy hydrocodone she would have to pick up the prescription. Please discuss with the patient figure out what she would like then let me know.

## 2013-10-31 ENCOUNTER — Encounter: Payer: Self-pay | Admitting: Nurse Practitioner

## 2013-10-31 ENCOUNTER — Ambulatory Visit (INDEPENDENT_AMBULATORY_CARE_PROVIDER_SITE_OTHER): Payer: BC Managed Care – PPO | Admitting: Nurse Practitioner

## 2013-10-31 VITALS — BP 132/78 | Ht 67.0 in | Wt 167.0 lb

## 2013-10-31 DIAGNOSIS — M545 Low back pain, unspecified: Secondary | ICD-10-CM

## 2013-10-31 DIAGNOSIS — M792 Neuralgia and neuritis, unspecified: Secondary | ICD-10-CM

## 2013-10-31 DIAGNOSIS — IMO0002 Reserved for concepts with insufficient information to code with codable children: Secondary | ICD-10-CM

## 2013-10-31 MED ORDER — CYCLOBENZAPRINE HCL 10 MG PO TABS: 10.0000 mg | ORAL_TABLET | Freq: Three times a day (TID) | ORAL | Status: DC | PRN

## 2013-10-31 NOTE — Progress Notes (Signed)
Subjective:  Presents complaints of intense left low back pain that began 3 days ago. Has been going to the chiropractor for some left low back pain. Saturday she turned slightly and felt a pulling sensation. Pain went down her left leg, patient was unable to walk. Her family had to help her get into the house. Continues to have significant pain, mobility has improved. Pain radiates from the left low back area across the hip into the lateral leg occasionally going to the foot. Describes a burning sensation in the left lateral leg. Leg is slightly weak. Occasional tingling/slight numb feeling in the left foot. No change in bowel or bladder habits. Currently completing a course of prednisone which was started on 3/5. Minimal relief with ice, heat, pain medication. Has been doing some stretching exercises. Is not taking ibuprofen while on prednisone.  Objective:   BP 132/78  Ht 5\' 7"  (1.702 m)  Wt 167 lb (75.751 kg)  BMI 26.15 kg/m2 NAD. Alert, oriented. Lungs clear. Heart regular rate rhythm. No lumbar spinal tenderness noted. Tenderness noted in the left lumbar paraspinal area into the left buttock. Tenderness along the left lateral area along the left hip. SLR negative on the right, slightly positive on the left can perform passive movement of the left leg to 90. Good ROM of the left hip with minimal tenderness. Reflexes normal limit lower extremities.  Assessment:Low back pain  Neuralgia: left leg  Plan: Meds ordered this encounter  Medications  . ibuprofen (ADVIL,MOTRIN) 600 MG tablet    Sig: Take 600 mg by mouth every 6 (six) hours as needed.  . cyclobenzaprine (FLEXERIL) 10 MG tablet    Sig: Take 1 tablet (10 mg total) by mouth 3 (three) times daily as needed for muscle spasms.    Dispense:  30 tablet    Refill:  0    Order Specific Question:  Supervising Provider    Answer:  Mikey Kirschner [[id number]]   Add muscle relaxant and OTC TENS unit to regimen. Continue other interventions.  Drowsiness precautions with Flexeril. Call back on 3/16 if no improvement, will need an MRI at that time.

## 2013-10-31 NOTE — Patient Instructions (Signed)
Icy Hot Smart Relief 

## 2013-11-16 ENCOUNTER — Other Ambulatory Visit (HOSPITAL_COMMUNITY): Payer: Self-pay | Admitting: Chiropractic Medicine

## 2013-11-16 ENCOUNTER — Ambulatory Visit (HOSPITAL_COMMUNITY)
Admission: RE | Admit: 2013-11-16 | Discharge: 2013-11-16 | Disposition: A | Discharge status: Home / Self Care | Payer: BC Managed Care – PPO | Source: Ambulatory Visit | Attending: Chiropractic Medicine | Admitting: Chiropractic Medicine

## 2013-11-16 DIAGNOSIS — M545 Low back pain, unspecified: Secondary | ICD-10-CM

## 2013-11-16 DIAGNOSIS — M25552 Pain in left hip: Secondary | ICD-10-CM

## 2013-11-19 ENCOUNTER — Other Ambulatory Visit: Payer: Self-pay | Admitting: Family Medicine

## 2014-01-07 DIAGNOSIS — F419 Anxiety disorder, unspecified: Secondary | ICD-10-CM | POA: Insufficient documentation

## 2014-01-22 DIAGNOSIS — M48061 Spinal stenosis, lumbar region without neurogenic claudication: Secondary | ICD-10-CM | POA: Insufficient documentation

## 2014-02-05 DIAGNOSIS — Z9889 Other specified postprocedural states: Secondary | ICD-10-CM | POA: Insufficient documentation

## 2014-04-30 ENCOUNTER — Ambulatory Visit (INDEPENDENT_AMBULATORY_CARE_PROVIDER_SITE_OTHER): Payer: BC Managed Care – PPO | Admitting: Nurse Practitioner

## 2014-04-30 ENCOUNTER — Encounter: Payer: Self-pay | Admitting: Nurse Practitioner

## 2014-04-30 VITALS — BP 126/80 | Ht 67.0 in | Wt 169.0 lb

## 2014-04-30 DIAGNOSIS — N390 Urinary tract infection, site not specified: Secondary | ICD-10-CM

## 2014-04-30 LAB — POCT URINALYSIS DIPSTICK
Bilirubin, UA: 2
GLUCOSE UA: NEGATIVE
Ketones, UA: POSITIVE
Leukocytes, UA: NEGATIVE
Protein, UA: NEGATIVE
RBC UA: NEGATIVE
Spec Grav, UA: <=1.005
pH, UA: 5

## 2014-04-30 MED ORDER — CEFPROZIL 500 MG PO TABS: 500.0000 mg | ORAL_TABLET | Freq: Two times a day (BID) | ORAL | Status: DC

## 2014-04-30 NOTE — Patient Instructions (Signed)
AZO as directed

## 2014-05-01 ENCOUNTER — Encounter: Payer: Self-pay | Admitting: Nurse Practitioner

## 2014-05-01 LAB — POCT UA - MICROSCOPIC ONLY: Bacteria, U Microscopic: POSITIVE

## 2014-05-01 NOTE — Progress Notes (Signed)
Subjective:  Presents for complaints of urinary symptoms over the past week, worse over the past 3 days. No fever. Urinary frequency urgency and burning/pain with urination. Has been told by her gynecologist for her bladder has prolapsed. No significant incontinence. Married, same sexual partner. No history of recent UTI. Taking fluids well. No nausea vomiting. No back or flank pain. Has been taking AZO which greatly helps her symptoms.  Objective:   BP 126/80  Ht 5\' 7"  (1.702 m)  Wt 169 lb (76.658 kg)  BMI 26.46 kg/m2 NAD. Alert, oriented. Lungs clear. Heart regular rate rhythm. No CVA or flank tenderness. Abdomen soft nondistended with mild suprapubic discomfort on exam.  Assessment: Urinary tract infection, site not specified - Plan: POCT urinalysis dipstick, Urine Culture, CANCELED: Urine culture  Results for orders placed in visit on 04/30/14  POCT URINALYSIS DIPSTICK      Result Value Ref Range   Color, UA       Clarity, UA       Glucose, UA negative     Bilirubin, UA 2     Ketones, UA positive     Spec Grav, UA <=1.005     Blood, UA negative     pH, UA 5.0     Protein, UA negative     Urobilinogen, UA       Nitrite, UA       Leukocytes, UA Negative    POCT UA - MICROSCOPIC ONLY      Result Value Ref Range   WBC, Ur, HPF, POC 5+     RBC, urine, microscopic rare     Bacteria, U Microscopic pos     Mucus, UA       Epithelial cells, urine per micros rare     Crystals, Ur, HPF, POC       Casts, Ur, LPF, POC       Yeast, UA        Plan:  Meds ordered this encounter  Medications  . cefPROZIL (CEFZIL) 500 MG tablet    Sig: Take 1 tablet (500 mg total) by mouth 2 (two) times daily.    Dispense:  14 tablet    Refill:  0    Order Specific Question:  Supervising Provider    Answer:  Mikey Kirschner [[id number]]   Continue AZO for 48 hours then stop. Call back in 72 hours if no improvement, sooner if worse.

## 2014-05-02 LAB — URINE CULTURE: Colony Count: 9000

## 2014-05-20 ENCOUNTER — Other Ambulatory Visit: Payer: Self-pay | Admitting: Family Medicine

## 2014-06-18 ENCOUNTER — Telehealth: Payer: Self-pay | Admitting: Family Medicine

## 2014-06-18 NOTE — Telephone Encounter (Signed)
Patient said that she is still having neck and shoulder pain.  The chiropractor said its a pinched nerve.  She would like to try some pain meds before she comes in the office.  She has tried Flexeril, hydrocodone, tramadol, pain cream with no relief.

## 2014-06-19 NOTE — Telephone Encounter (Signed)
[facility] - pt needs office visit

## 2014-06-19 NOTE — Telephone Encounter (Signed)
Discussed with patient. Offered appt for tomorrow but pt states she will call back next week to schedule office visit.

## 2014-06-24 ENCOUNTER — Encounter: Payer: Self-pay | Admitting: Nurse Practitioner

## 2014-07-23 ENCOUNTER — Other Ambulatory Visit: Payer: Self-pay | Admitting: Family Medicine

## 2014-07-25 ENCOUNTER — Other Ambulatory Visit: Payer: Self-pay

## 2014-07-25 DIAGNOSIS — Z1231 Encounter for screening mammogram for malignant neoplasm of breast: Secondary | ICD-10-CM

## 2014-08-02 ENCOUNTER — Ambulatory Visit
Admission: RE | Admit: 2014-08-02 | Discharge: 2014-08-02 | Disposition: A | Discharge status: Home / Self Care | Payer: BC Managed Care – PPO | Source: Ambulatory Visit

## 2014-08-02 DIAGNOSIS — Z1231 Encounter for screening mammogram for malignant neoplasm of breast: Secondary | ICD-10-CM

## 2014-09-16 ENCOUNTER — Other Ambulatory Visit: Payer: Self-pay | Admitting: Family Medicine

## 2014-10-04 DIAGNOSIS — M545 Low back pain, unspecified: Secondary | ICD-10-CM | POA: Insufficient documentation

## 2014-10-25 ENCOUNTER — Other Ambulatory Visit: Payer: Self-pay | Admitting: Family Medicine

## 2014-10-25 ENCOUNTER — Telehealth: Payer: Self-pay | Admitting: Family Medicine

## 2014-10-25 NOTE — Telephone Encounter (Signed)
Notified patient via VM stating we sent in the med.

## 2014-10-25 NOTE — Telephone Encounter (Signed)
Pt is requesting a refill on her wellbutrin. Today is the second day  Of her being out of it.   [employer]

## 2014-11-06 ENCOUNTER — Encounter: Payer: Self-pay | Admitting: Family Medicine

## 2014-11-06 DIAGNOSIS — Z029 Encounter for administrative examinations, unspecified: Secondary | ICD-10-CM

## 2014-11-26 ENCOUNTER — Other Ambulatory Visit: Payer: Self-pay | Admitting: Family Medicine

## 2014-11-28 ENCOUNTER — Encounter: Payer: Self-pay | Admitting: Family Medicine

## 2014-11-28 ENCOUNTER — Ambulatory Visit (INDEPENDENT_AMBULATORY_CARE_PROVIDER_SITE_OTHER): Payer: BLUE CROSS/BLUE SHIELD | Admitting: Family Medicine

## 2014-11-28 VITALS — BP 132/74 | Wt 181.0 lb

## 2014-11-28 DIAGNOSIS — R5383 Other fatigue: Secondary | ICD-10-CM

## 2014-11-28 DIAGNOSIS — Z13220 Encounter for screening for lipoid disorders: Secondary | ICD-10-CM | POA: Diagnosis not present

## 2014-11-28 DIAGNOSIS — E785 Hyperlipidemia, unspecified: Secondary | ICD-10-CM | POA: Diagnosis not present

## 2014-11-28 DIAGNOSIS — R635 Abnormal weight gain: Secondary | ICD-10-CM | POA: Diagnosis not present

## 2014-11-28 MED ORDER — BUPROPION HCL ER (XL) 300 MG PO TB24: ORAL_TABLET | ORAL | Status: DC

## 2014-11-28 NOTE — Progress Notes (Signed)
   Subjective:    Patient ID: Madison Reyes, female    DOB: [birth year], 54 y.o.   MRN: [id number]  HPI pt does wellness at ob/gyn. Pt here today for med check.   bone and joint pain all over. Started over 1 year ago.   Left leg numb. Had back surgery June 2015. We had a long discussion about this this is permanent nerve damage there is nothing in need to be done 4.  Swollen ankles. Started about 2 months ago. On physical examination she has puffy ankles but is not fluid I believe it is related to her weight gain   Anxiety level has gone up. She denies being depressed she feels like medication is helping her. She does not want to go on any type of nerve pills.  Needs wellbutrin refilled. Requesting 90 day supply.   Weight going up and sugar addiciation.   Requesting lipid and glucose checked.   Requesting referral for prolapsed bladder.     Review of Systems  Constitutional: Negative for activity change, appetite change and fatigue.  HENT: Negative for congestion.   Respiratory: Negative for cough.   Cardiovascular: Negative for chest pain.  Gastrointestinal: Negative for abdominal pain.  Endocrine: Negative for polydipsia and polyphagia.  Neurological: Negative for weakness.  Psychiatric/Behavioral: Negative for confusion.       Objective:   Physical Exam  Constitutional: She appears well-nourished. No distress.  Cardiovascular: Normal rate, regular rhythm and normal heart sounds.   No murmur heard. Pulmonary/Chest: Effort normal and breath sounds normal. No respiratory distress.  Musculoskeletal: She exhibits no edema.  Lymphadenopathy:    She has no cervical adenopathy.  Neurological: She is alert. She exhibits normal muscle tone.  Psychiatric: Her behavior is normal.  Vitals reviewed.         Assessment & Plan:  Fatigue tiredness related to expect the amount of work she is doing. She needs to exercise on a regular basis try to lose weight watch diet closely  check lab work were Medicines was given 15-20 minutes spent with patient  Follow-up in one year

## 2014-12-01 ENCOUNTER — Telehealth: Payer: Self-pay | Admitting: Family Medicine

## 2014-12-01 LAB — BASIC METABOLIC PANEL
BUN/Creatinine Ratio: 14 (ref 9–23)
BUN: 10 mg/dL (ref 6–24)
CALCIUM: 9.3 mg/dL (ref 8.7–10.2)
CHLORIDE: 102 mmol/L (ref 97–108)
CO2: 23 mmol/L (ref 18–29)
Creatinine, Ser: 0.74 mg/dL (ref 0.57–1.00)
GFR calc Af Amer: 107 mL/min/{1.73_m2} (ref >59)
GFR calc non Af Amer: 93 mL/min/{1.73_m2} (ref >59)
Glucose: 83 mg/dL (ref 65–99)
POTASSIUM: 3.9 mmol/L (ref 3.5–5.2)
SODIUM: 143 mmol/L (ref 134–144)

## 2014-12-01 LAB — CBC WITH DIFFERENTIAL/PLATELET
BASOS: 0 %
Basophils Absolute: 0 10*3/uL (ref 0.0–0.2)
Eos: 3 %
Eosinophils Absolute: 0.2 10*3/uL (ref 0.0–0.4)
HCT: 39 % (ref 34.0–46.6)
HEMOGLOBIN: 13.1 g/dL (ref 11.1–15.9)
Immature Grans (Abs): 0 10*3/uL (ref 0.0–0.1)
Immature Granulocytes: 0 %
Lymphocytes Absolute: 2.6 10*3/uL (ref 0.7–3.1)
Lymphs: 35 %
MCH: 32 pg (ref 26.6–33.0)
MCHC: 33.6 g/dL (ref 31.5–35.7)
MCV: 95 fL (ref 79–97)
MONOCYTES: 8 %
MONOS ABS: 0.6 10*3/uL (ref 0.1–0.9)
Neutrophils Absolute: 3.8 10*3/uL (ref 1.4–7.0)
Neutrophils Relative %: 54 %
PLATELETS: 260 10*3/uL (ref 150–379)
RBC: 4.1 x10E6/uL (ref 3.77–5.28)
RDW: 13 % (ref 12.3–15.4)
WBC: 7.2 10*3/uL (ref 3.4–10.8)

## 2014-12-01 LAB — LIPID PANEL
Chol/HDL Ratio: 4.6 ratio units — ABNORMAL HIGH (ref 0.0–4.4)
Cholesterol, Total: 270 mg/dL — ABNORMAL HIGH (ref 100–199)
HDL: 59 mg/dL (ref >39)
LDL CALC: 168 mg/dL — AB (ref 0–99)
Triglycerides: 217 mg/dL — ABNORMAL HIGH (ref 0–149)
VLDL Cholesterol Cal: 43 mg/dL — ABNORMAL HIGH (ref 5–40)

## 2014-12-01 LAB — TSH: TSH: 1.66 u[IU]/mL (ref 0.450–4.500)

## 2014-12-01 NOTE — Telephone Encounter (Signed)
plz put in for referral to urology - note to [place] pt wants female urologist she can not remember presently, plz call her to clarify, reason for referral vagicoceole , prolapsed bladder

## 2014-12-02 NOTE — Telephone Encounter (Signed)
[facility]

## 2014-12-03 NOTE — Telephone Encounter (Signed)
Patient notified and verbalized understanding of the test results. No further questions. 

## 2014-12-03 NOTE — Addendum Note (Signed)
Addended byCharolotte Capuchin D on: 12/03/2014 04:37 PM   Modules accepted: Orders

## 2014-12-03 NOTE — Progress Notes (Signed)
Patient notified and verbalized understanding of the test results. No further questions. 

## 2014-12-04 NOTE — Telephone Encounter (Signed)
Pt to call back with name of specialist

## 2014-12-04 NOTE — Telephone Encounter (Signed)
Patient notified and verbalized understanding of the test results. No further questions. Will call us back with what urologist she wants to be referred to.

## 2014-12-12 NOTE — Telephone Encounter (Signed)
Left message to return call 

## 2015-01-09 ENCOUNTER — Telehealth: Payer: Self-pay | Admitting: Family Medicine

## 2015-01-09 NOTE — Telephone Encounter (Signed)
Please discuss with Madison Reyes I did NOT do a wellness, I discussed weight gain and other issues this is beyond a scope of a wellness. Wellness physical is purely to discuss wellness, plz see my dictation, but once again if pt ill about this credit the copay and explain our approach and situation for future use

## 2015-01-09 NOTE — Telephone Encounter (Signed)
[place] help [place]. In this situation most ob gyn will code there interactions as a physical. So therefore #1- try to help the pt #2- if possible find out was the initial visit with OB coded a physical ( you may have to find out from the source or the OB ) #3- very difficult situation plz work with [place], if we have to credit her a copay to satisfy so be it

## 2015-01-09 NOTE — Telephone Encounter (Signed)
Patient had a question regarding her bill today and in our conversation, she explained to me that on 11/28/2014 she was not supposed to be charged a co-pay because this was supposed to be counted as a physical.  In the office note, I told her that Dr. Nicki Reaper had noted that she gets her physicals done at an OBGYN and she said that she only gets her pap smears at the [facility] and she always gets her physicals with Dr. Nicki Reaper.  Please advise.

## 2015-01-15 NOTE — Telephone Encounter (Signed)
LMOM

## 2015-03-24 ENCOUNTER — Telehealth: Payer: Self-pay | Admitting: Family Medicine

## 2015-03-24 NOTE — Telephone Encounter (Signed)
Patient would like order for BW to have cholesterol checked.  She says it is about time.

## 2015-03-24 NOTE — Telephone Encounter (Signed)
Nurse's-lipid was ordered back in April. If that order is no longer good please cancel it in reorder lipid-reason-hyperlipidemia

## 2015-03-25 NOTE — Telephone Encounter (Signed)
Pt notified she can go over and do bw

## 2015-04-07 ENCOUNTER — Telehealth: Payer: Self-pay | Admitting: Family Medicine

## 2015-04-07 NOTE — Telephone Encounter (Signed)
Left message on voicemail notifying patient that lab paper has been faxed to [facility].

## 2015-04-07 NOTE — Telephone Encounter (Signed)
Pt states she went to [facility] last Saturday an they state They did not have a lab for her to do, however when you look at our labs Sent she has one ordered on the 12th of apr which remains uncollected   Can we resend this lab to [facility]

## 2015-04-13 ENCOUNTER — Encounter: Payer: Self-pay | Admitting: Family Medicine

## 2015-04-13 LAB — LIPID PANEL
Chol/HDL Ratio: 4.6 ratio units — ABNORMAL HIGH (ref 0.0–4.4)
Cholesterol, Total: 267 mg/dL — ABNORMAL HIGH (ref 100–199)
HDL: 58 mg/dL (ref >39)
LDL Calculated: 180 mg/dL — ABNORMAL HIGH (ref 0–99)
Triglycerides: 143 mg/dL (ref 0–149)
VLDL Cholesterol Cal: 29 mg/dL (ref 5–40)

## 2015-06-27 ENCOUNTER — Other Ambulatory Visit: Payer: Self-pay

## 2015-06-27 DIAGNOSIS — Z1231 Encounter for screening mammogram for malignant neoplasm of breast: Secondary | ICD-10-CM

## 2015-07-24 ENCOUNTER — Telehealth: Payer: Self-pay | Admitting: Family Medicine

## 2015-07-24 NOTE — Telephone Encounter (Signed)
Pt called stating that she is going out of town and is wanting to know if she can get xanax for anxiety while flying. Please advise.

## 2015-07-24 NOTE — Telephone Encounter (Signed)
I spoke with the patient. Please send in Xanax 0.5 mg one half to one tablet twice a day when necessary anxiety, #24, no refill-caution drowsiness

## 2015-07-25 MED ORDER — ALPRAZOLAM 0.5 MG PO TABS: ORAL_TABLET | ORAL | Status: DC

## 2015-07-25 NOTE — Telephone Encounter (Signed)
Script printed, awaiting signature, will be faxed today

## 2015-08-08 ENCOUNTER — Ambulatory Visit: Payer: Self-pay

## 2015-09-08 ENCOUNTER — Ambulatory Visit
Admission: RE | Admit: 2015-09-08 | Discharge: 2015-09-08 | Disposition: A | Discharge status: Home / Self Care | Payer: BLUE CROSS/BLUE SHIELD | Source: Ambulatory Visit

## 2015-09-08 DIAGNOSIS — Z1231 Encounter for screening mammogram for malignant neoplasm of breast: Secondary | ICD-10-CM

## 2015-09-15 ENCOUNTER — Ambulatory Visit: Payer: Self-pay

## 2015-10-03 ENCOUNTER — Other Ambulatory Visit: Payer: Self-pay | Admitting: Family Medicine

## 2016-01-08 ENCOUNTER — Encounter: Payer: BLUE CROSS/BLUE SHIELD | Admitting: Family Medicine

## 2016-02-25 ENCOUNTER — Encounter: Payer: Self-pay | Admitting: Family Medicine

## 2016-02-25 ENCOUNTER — Ambulatory Visit (INDEPENDENT_AMBULATORY_CARE_PROVIDER_SITE_OTHER): Payer: BLUE CROSS/BLUE SHIELD | Admitting: Family Medicine

## 2016-02-25 VITALS — BP 126/76 | Ht 67.0 in | Wt 180.4 lb

## 2016-02-25 DIAGNOSIS — Z0000 Encounter for general adult medical examination without abnormal findings: Secondary | ICD-10-CM

## 2016-02-25 DIAGNOSIS — E785 Hyperlipidemia, unspecified: Secondary | ICD-10-CM

## 2016-02-25 DIAGNOSIS — F329 Major depressive disorder, single episode, unspecified: Secondary | ICD-10-CM

## 2016-02-25 DIAGNOSIS — R635 Abnormal weight gain: Secondary | ICD-10-CM

## 2016-02-25 DIAGNOSIS — R5383 Other fatigue: Secondary | ICD-10-CM | POA: Diagnosis not present

## 2016-02-25 DIAGNOSIS — F32A Depression, unspecified: Secondary | ICD-10-CM

## 2016-02-25 NOTE — Progress Notes (Addendum)
   Subjective:    Patient ID: Madison Reyes, female    DOB: Oct 09, 1960, 55 y.o.   MRN: [id number]  HPI The patient comes in today for a wellness visit.    A review of their health history was completed.  A review of medications was also completed.  Any needed refills; Yes  Eating habits: Patient states eating is good. States tries to eat a balance diet.  Falls/  MVA accidents in past few months: None  Regular exercise: Patient states walks once a week Specialist pt sees on regular basis: See's a Restaurant manager, fast food.  Preventative health issues were discussed.   Additional concerns: Patient has concerns of weight gain Patient relates she has gained some weight. She has been try to exercise some but doesn't often feel like it. Does try to watch how she eats., fatigue-patient with significant fatigue and tiredness but denies sleep apnea symptoms., depression-patient does relate times of feeling tearfulness and sadness related to her sons up-and-down motivation. Patient denies being suicidal. Patient does relate taking your medicines as directed., and back and surgical site pain denies any pain radiating down the legs relates just occasional general aches and discomforts  Patient with a significant history in the past of elevated LDL she has been try to watch her diet she does not want to be on medications she is interested in having this rechecked previous labs were reviewed with the patient  Review of Systems  Constitutional: Positive for fatigue. Negative for fever.  HENT: Negative for congestion.   Respiratory: Negative for cough and shortness of breath.   Cardiovascular: Negative for chest pain.  Gastrointestinal: Negative for abdominal pain.  Musculoskeletal: Negative for back pain.  Neurological: Negative for headaches.       Objective:   Physical Exam HEENT eardrums normal throat is normal neck no masses lungs are clear no crackles heart is regular no murmurs abdomen soft  extremities no edema neurologic grossly normal patient states she gets breast exam and pelvic exam through her gynecologist       Assessment & Plan:  Weight gain-I believe this is probably related to metabolism as well as getting older. Importance of regular physical activity and exercise discussed. Monitoring calories as well as minimizing carbohydrates discussed. We will check thyroid function  Fatigue-I believe that this is related into combination of getting older and stress. Importance objective thyroid function discussed.  Depression stable on Wellbutrin may continue this. Has a lot of stress with her son. Encourage patient to consider some counseling in order to help her with mental health the patient is not suicidal but would benefit from counseling to learn how to cope with her sons growing up  Moderate hyperlipidemia patient states she's been watching diet we will recheck lab work previous lab work reviewed  Patient up-to-date on colonoscopy. Patient states she's up-to-date with preventative measures 3 gynecologist. Safety dietary discussed. Flu vaccine this fall. I don't recommend shingles vaccine until age 82. Bone density not indicated.  Family history of colon cancer patient had her colonoscopy December 2013 next one is December 2018 with Dr.Medoff

## 2016-02-26 ENCOUNTER — Telehealth: Payer: Self-pay | Admitting: Family Medicine

## 2016-02-26 NOTE — Telephone Encounter (Signed)
Amanda-as we discussed this was coded as a physical. There were separate identifiable issues covered as well that fell outside the realm of wellness. Those were also noted appropriately

## 2016-02-26 NOTE — Telephone Encounter (Signed)
Patient was seen yesterday and wanted me to let you know to please be sure to code her visit as a physical.

## 2016-02-28 LAB — CBC WITH DIFFERENTIAL/PLATELET
BASOS ABS: 0 10*3/uL (ref 0.0–0.2)
Basos: 0 %
EOS (ABSOLUTE): 0.2 10*3/uL (ref 0.0–0.4)
Eos: 3 %
Hematocrit: 39.2 % (ref 34.0–46.6)
Hemoglobin: 13.2 g/dL (ref 11.1–15.9)
Immature Grans (Abs): 0 10*3/uL (ref 0.0–0.1)
Immature Granulocytes: 0 %
LYMPHS ABS: 3.3 10*3/uL — AB (ref 0.7–3.1)
Lymphs: 43 %
MCH: 32.3 pg (ref 26.6–33.0)
MCHC: 33.7 g/dL (ref 31.5–35.7)
MCV: 96 fL (ref 79–97)
Monocytes Absolute: 0.5 10*3/uL (ref 0.1–0.9)
Monocytes: 6 %
NEUTROS ABS: 3.6 10*3/uL (ref 1.4–7.0)
Neutrophils: 48 %
PLATELETS: 261 10*3/uL (ref 150–379)
RBC: 4.09 x10E6/uL (ref 3.77–5.28)
RDW: 13.1 % (ref 12.3–15.4)
WBC: 7.7 10*3/uL (ref 3.4–10.8)

## 2016-02-28 LAB — HEPATIC FUNCTION PANEL
ALT: 15 IU/L (ref 0–32)
AST: 17 IU/L (ref 0–40)
Albumin: 4.5 g/dL (ref 3.5–5.5)
Alkaline Phosphatase: 53 IU/L (ref 39–117)
BILIRUBIN TOTAL: 0.7 mg/dL (ref 0.0–1.2)
BILIRUBIN, DIRECT: 0.14 mg/dL (ref 0.00–0.40)
Total Protein: 7.1 g/dL (ref 6.0–8.5)

## 2016-02-28 LAB — LIPID PANEL
Chol/HDL Ratio: 4.7 ratio units — ABNORMAL HIGH (ref 0.0–4.4)
Cholesterol, Total: 271 mg/dL — ABNORMAL HIGH (ref 100–199)
HDL: 58 mg/dL (ref >39)
LDL Calculated: 180 mg/dL — ABNORMAL HIGH (ref 0–99)
TRIGLYCERIDES: 165 mg/dL — AB (ref 0–149)
VLDL CHOLESTEROL CAL: 33 mg/dL (ref 5–40)

## 2016-02-28 LAB — BASIC METABOLIC PANEL
BUN / CREAT RATIO: 13 (ref 9–23)
BUN: 11 mg/dL (ref 6–24)
CO2: 22 mmol/L (ref 18–29)
CREATININE: 0.87 mg/dL (ref 0.57–1.00)
Calcium: 10 mg/dL (ref 8.7–10.2)
Chloride: 101 mmol/L (ref 96–106)
GFR calc Af Amer: 87 mL/min/{1.73_m2} (ref >59)
GFR calc non Af Amer: 76 mL/min/{1.73_m2} (ref >59)
Glucose: 84 mg/dL (ref 65–99)
Potassium: 3.5 mmol/L (ref 3.5–5.2)
Sodium: 142 mmol/L (ref 134–144)

## 2016-02-28 LAB — TSH: TSH: 2.55 u[IU]/mL (ref 0.450–4.500)

## 2016-03-02 ENCOUNTER — Encounter: Payer: Self-pay | Admitting: Family Medicine

## 2016-03-03 ENCOUNTER — Telehealth (HOSPITAL_COMMUNITY): Payer: Self-pay | Admitting: *Deleted

## 2016-03-03 NOTE — Telephone Encounter (Signed)
left voice message regarding an appointment. 

## 2016-03-04 ENCOUNTER — Telehealth (HOSPITAL_COMMUNITY): Payer: Self-pay | Admitting: *Deleted

## 2016-03-04 NOTE — Telephone Encounter (Signed)
left voice message regarding an appointment. 

## 2016-03-04 NOTE — Telephone Encounter (Signed)
patient returned phone call.   She said she is on a temporary job assignment and her hours will not allow for her to schedule an appointment right now.  She will call when she completes this assignment.

## 2016-03-21 ENCOUNTER — Other Ambulatory Visit: Payer: Self-pay | Admitting: Family Medicine

## 2016-05-03 ENCOUNTER — Telehealth (HOSPITAL_COMMUNITY): Payer: Self-pay | Admitting: *Deleted

## 2016-05-03 NOTE — Telephone Encounter (Signed)
left voice message regarding an appointment. 

## 2016-05-11 ENCOUNTER — Telehealth (HOSPITAL_COMMUNITY): Payer: Self-pay | Admitting: *Deleted

## 2016-05-11 NOTE — Telephone Encounter (Signed)
spoke with patient who said she work until 5:00 and she can't come for an appointment before 5:00.

## 2016-08-25 ENCOUNTER — Other Ambulatory Visit: Payer: Self-pay | Admitting: Obstetrics and Gynecology

## 2016-08-25 DIAGNOSIS — Z1231 Encounter for screening mammogram for malignant neoplasm of breast: Secondary | ICD-10-CM

## 2016-09-13 ENCOUNTER — Ambulatory Visit
Admission: RE | Admit: 2016-09-13 | Discharge: 2016-09-13 | Disposition: A | Discharge status: Home / Self Care | Payer: BLUE CROSS/BLUE SHIELD | Source: Ambulatory Visit | Attending: Obstetrics and Gynecology | Admitting: Obstetrics and Gynecology

## 2016-09-13 DIAGNOSIS — Z1231 Encounter for screening mammogram for malignant neoplasm of breast: Secondary | ICD-10-CM

## 2016-09-30 ENCOUNTER — Other Ambulatory Visit: Payer: Self-pay | Admitting: Family Medicine

## 2016-10-02 NOTE — Telephone Encounter (Signed)
Patient may have this prescription plus four additional refills

## 2016-10-04 NOTE — Telephone Encounter (Signed)
Please see previous message

## 2017-04-05 ENCOUNTER — Other Ambulatory Visit: Payer: Self-pay | Admitting: Family Medicine

## 2017-04-05 NOTE — Telephone Encounter (Signed)
Last seen 02/25/2016

## 2017-04-05 NOTE — Telephone Encounter (Signed)
May have one refill-send noticed the patient-Needs office visit before further refills

## 2017-07-04 ENCOUNTER — Other Ambulatory Visit: Payer: Self-pay | Admitting: Family Medicine

## 2017-07-04 NOTE — Telephone Encounter (Signed)
May have 30 tablets needs office visit

## 2017-07-04 NOTE — Telephone Encounter (Signed)
Last seen 02/25/16

## 2017-08-01 ENCOUNTER — Other Ambulatory Visit: Payer: Self-pay | Admitting: Family Medicine

## 2017-08-02 NOTE — Telephone Encounter (Signed)
This +1 refill needs office visit 

## 2017-08-02 NOTE — Telephone Encounter (Signed)
Last seen July 2017 for Wellness exam

## 2017-08-22 ENCOUNTER — Other Ambulatory Visit: Payer: Self-pay | Admitting: Obstetrics and Gynecology

## 2017-08-22 DIAGNOSIS — Z1231 Encounter for screening mammogram for malignant neoplasm of breast: Secondary | ICD-10-CM

## 2017-09-05 ENCOUNTER — Other Ambulatory Visit: Payer: Self-pay | Admitting: Family Medicine

## 2017-09-05 NOTE — Telephone Encounter (Signed)
May have 15 tablets needs office visit

## 2017-09-14 ENCOUNTER — Ambulatory Visit: Payer: BLUE CROSS/BLUE SHIELD

## 2017-10-04 ENCOUNTER — Ambulatory Visit
Admission: RE | Admit: 2017-10-04 | Discharge: 2017-10-04 | Disposition: A | Discharge status: Home / Self Care | Payer: Self-pay | Source: Ambulatory Visit | Attending: Obstetrics and Gynecology | Admitting: Obstetrics and Gynecology

## 2017-10-04 DIAGNOSIS — Z1231 Encounter for screening mammogram for malignant neoplasm of breast: Secondary | ICD-10-CM

## 2017-10-11 DIAGNOSIS — K5904 Chronic idiopathic constipation: Secondary | ICD-10-CM | POA: Insufficient documentation

## 2017-10-22 ENCOUNTER — Other Ambulatory Visit: Payer: Self-pay | Admitting: Family Medicine

## 2017-10-24 ENCOUNTER — Telehealth: Payer: Self-pay | Admitting: Family Medicine

## 2017-10-24 MED ORDER — BUPROPION HCL ER (XL) 300 MG PO TB24: ORAL_TABLET | ORAL | 0 refills | Status: DC

## 2017-10-24 NOTE — Telephone Encounter (Signed)
Patient needs to call for additional refills-she needs to set up an office visit as well

## 2017-10-24 NOTE — Telephone Encounter (Signed)
Prescription sent electronically to pharmacy. 

## 2017-10-24 NOTE — Telephone Encounter (Signed)
Patient schedule an appt for 10-31-17 and wants to get enough Wellbutrin till make it to her appt.  Uses [facility].

## 2017-10-24 NOTE — Telephone Encounter (Signed)
Wellbutrin XL, may have 30-day supply 1 daily 300 mg

## 2017-10-31 ENCOUNTER — Ambulatory Visit (INDEPENDENT_AMBULATORY_CARE_PROVIDER_SITE_OTHER): Payer: Commercial Managed Care - PPO | Admitting: Nurse Practitioner

## 2017-10-31 ENCOUNTER — Encounter: Payer: Self-pay | Admitting: Nurse Practitioner

## 2017-10-31 VITALS — BP 120/80 | Temp 98.7°F | Ht 67.0 in | Wt 149.0 lb

## 2017-10-31 DIAGNOSIS — Z79899 Other long term (current) drug therapy: Secondary | ICD-10-CM | POA: Diagnosis not present

## 2017-10-31 DIAGNOSIS — R5383 Other fatigue: Secondary | ICD-10-CM | POA: Diagnosis not present

## 2017-10-31 DIAGNOSIS — Z13220 Encounter for screening for lipoid disorders: Secondary | ICD-10-CM

## 2017-10-31 DIAGNOSIS — F418 Other specified anxiety disorders: Secondary | ICD-10-CM | POA: Diagnosis not present

## 2017-10-31 DIAGNOSIS — R739 Hyperglycemia, unspecified: Secondary | ICD-10-CM

## 2017-10-31 MED ORDER — BUPROPION HCL ER (XL) 300 MG PO TB24: ORAL_TABLET | ORAL | 1 refills | Status: DC

## 2017-10-31 NOTE — Progress Notes (Signed)
   Subjective:    Patient ID: Madison Reyes, female    DOB: Sep 03, 1960, 57 y.o.   MRN: [id number]  HPI Presents for recheck on her anxiety and depression. Wanted to discuss stopping her Wellbutrin but realized that she needs to stay on med for now due to issues at home. Her husband has retired and her son is at home. Difficulty adjusting to changes in her life. 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 Has done counseling in the past in [place] which has gone well but cannot afford this now.  Depression screen [facility] 2/9 11/01/2017 10/31/2017  Decreased Interest 2 1  Down, Depressed, Hopeless 1 1  PHQ - 2 Score 3 2  Altered sleeping 2 -  Tired, decreased energy 2 -  Change in appetite 0 -  Feeling bad or failure about yourself  1 -  Trouble concentrating 2 -  Moving slowly or fidgety/restless 2 -  Suicidal thoughts 0 -  PHQ-9 Score 12 -  Difficult doing work/chores Somewhat difficult -   GAD 7 : Generalized Anxiety Score 11/01/2017  Nervous, Anxious, on Edge 3  Control/stop worrying 3  Worry too much - different things 3  Trouble relaxing 3  Restless 0  Easily annoyed or irritable 3  Afraid - awful might happen 3  Total GAD 7 Score 18  Anxiety Difficulty Somewhat difficult    Has been seeing provider at [facility]. This is not covered by her insurance. Described as a wellness program for weight loss. They have started her on armour thyroid and told her she is prediabetic. These labs are not available during her visit.    Review of Systems     Objective:   Physical Exam NAD. Alert, oriented. Thoughts logical, coherent and relevant. Dressed appropriately. Calm affect. Making good eye contact. Lungs clear. Heart RRR.         Assessment & Plan:   Problem List Items Addressed This Visit      Other   Depression with anxiety - Primary   Relevant Medications   buPROPion (WELLBUTRIN XL) 300 MG 24  hr tablet    Other Visit Diagnoses    High risk medication use       Relevant Orders   Basic metabolic panel   Hepatic function panel   Lipid screening       Relevant Orders   Lipid panel   Hyperglycemia       Relevant Orders   Hemoglobin A1c   Fatigue, unspecified type       Relevant Orders   TSH   VITAMIN D 25 Hydroxy (Vit-D Deficiency, Fractures)     Routine labs ordered. Gets regular gynecological exams. Defers other medication or counseling at this time. Discussed importance of stress reduction.  Return in about 6 months (around 05/03/2018) for recheck. Call back sooner if needed.

## 2017-11-01 ENCOUNTER — Encounter: Payer: Self-pay | Admitting: Nurse Practitioner

## 2017-11-07 LAB — BASIC METABOLIC PANEL
BUN/Creatinine Ratio: 16 (ref 9–23)
BUN: 14 mg/dL (ref 6–24)
CO2: 24 mmol/L (ref 20–29)
Calcium: 9.5 mg/dL (ref 8.7–10.2)
Chloride: 105 mmol/L (ref 96–106)
Creatinine, Ser: 0.89 mg/dL (ref 0.57–1.00)
GFR calc Af Amer: 84 mL/min/{1.73_m2} (ref >59)
GFR calc non Af Amer: 73 mL/min/{1.73_m2} (ref >59)
GLUCOSE: 79 mg/dL (ref 65–99)
Potassium: 4.3 mmol/L (ref 3.5–5.2)
SODIUM: 143 mmol/L (ref 134–144)

## 2017-11-07 LAB — TSH: TSH: 2.21 u[IU]/mL (ref 0.450–4.500)

## 2017-11-07 LAB — LIPID PANEL
Chol/HDL Ratio: 4.4 ratio (ref 0.0–4.4)
Cholesterol, Total: 260 mg/dL — ABNORMAL HIGH (ref 100–199)
HDL: 59 mg/dL (ref >39)
LDL CALC: 177 mg/dL — AB (ref 0–99)
Triglycerides: 122 mg/dL (ref 0–149)
VLDL CHOLESTEROL CAL: 24 mg/dL (ref 5–40)

## 2017-11-07 LAB — HEPATIC FUNCTION PANEL
ALBUMIN: 4.4 g/dL (ref 3.5–5.5)
ALT: 15 IU/L (ref 0–32)
AST: 18 IU/L (ref 0–40)
Alkaline Phosphatase: 47 IU/L (ref 39–117)
Bilirubin Total: 0.8 mg/dL (ref 0.0–1.2)
Bilirubin, Direct: 0.17 mg/dL (ref 0.00–0.40)
Total Protein: 6.8 g/dL (ref 6.0–8.5)

## 2017-11-07 LAB — HEMOGLOBIN A1C
ESTIMATED AVERAGE GLUCOSE: 97 mg/dL
HEMOGLOBIN A1C: 5 % (ref 4.8–5.6)

## 2017-11-07 LAB — VITAMIN D 25 HYDROXY (VIT D DEFICIENCY, FRACTURES): Vit D, 25-Hydroxy: 30.3 ng/mL (ref 30.0–100.0)

## 2017-11-19 LAB — GLUCOSE, POCT (MANUAL RESULT ENTRY): POC Glucose: 85 mg/dl (ref 70–99)

## 2017-11-19 LAB — POCT GLYCOSYLATED HEMOGLOBIN (HGB A1C): Hemoglobin A1C: 5.1

## 2018-02-07 ENCOUNTER — Other Ambulatory Visit: Payer: Self-pay | Admitting: Nurse Practitioner

## 2018-03-08 ENCOUNTER — Ambulatory Visit (INDEPENDENT_AMBULATORY_CARE_PROVIDER_SITE_OTHER): Payer: Commercial Managed Care - PPO | Admitting: Family Medicine

## 2018-03-08 ENCOUNTER — Encounter: Payer: Self-pay | Admitting: Family Medicine

## 2018-03-08 VITALS — BP 114/72 | Ht 66.0 in | Wt 150.0 lb

## 2018-03-08 DIAGNOSIS — Z0000 Encounter for general adult medical examination without abnormal findings: Secondary | ICD-10-CM

## 2018-03-08 NOTE — Progress Notes (Signed)
   Subjective:    Patient ID: Madison Reyes, female    DOB: 03-Aug-1961, 57 y.o.   MRN: [id number]  HPI The patient comes in today for a wellness visit.  Muscle aches and discomfort in various aspects of her body but no significant redness swelling or tenderness. She states her moods overall are doing well She denies being depressed She does take her medicine She would like to continue it  A review of their health history was completed.  A review of medications was also completed.  Any needed refills; none  Eating habits: health conscious  Falls/  MVA accidents in past few months: none  Regular exercise: walking  Specialist pt sees on regular basis: [facility] - [employer] in [place]  Preventative health issues were discussed.   Additional concerns: joint pain. Increased since Jan 2019    Review of Systems  Constitutional: Negative for activity change, appetite change and fatigue.  HENT: Negative for congestion.   Respiratory: Negative for cough.   Cardiovascular: Negative for chest pain.  Gastrointestinal: Negative for abdominal pain.  Endocrine: Negative for polydipsia and polyphagia.  Skin: Negative for color change.  Neurological: Negative for weakness.  Psychiatric/Behavioral: Negative for confusion.       Objective:   Physical Exam  Constitutional: She is oriented to person, place, and time. She appears well-developed and well-nourished.  HENT:  Head: Normocephalic.  Right Ear: External ear normal.  Left Ear: External ear normal.  Eyes: Pupils are equal, round, and reactive to light.  Neck: Normal range of motion. No thyromegaly present.  Cardiovascular: Normal rate, regular rhythm, normal heart sounds and intact distal pulses.  No murmur heard. Pulmonary/Chest: Effort normal and breath sounds normal. No respiratory distress. She has no wheezes.  Abdominal: Soft. Bowel sounds are normal. She exhibits no distension and no mass. There is no tenderness.    Musculoskeletal: Normal range of motion. She exhibits no edema or tenderness.  Lymphadenopathy:    She has no cervical adenopathy.  Neurological: She is alert and oriented to person, place, and time. She exhibits normal muscle tone.  Skin: Skin is warm and dry.  Psychiatric: She has a normal mood and affect. Her behavior is normal.          Assessment & Plan:  Adult wellness-complete.wellness physical was conducted today. Importance of diet and exercise were discussed in detail.  In addition to this a discussion regarding safety was also covered. We also reviewed over immunizations and gave recommendations regarding current immunization needed for age.  In addition to this additional areas were also touched on including: Preventative health exams needed:  Colonoscopy up-to-date  Patient was advised yearly wellness exam Cholesterol profile shows moderate elevation of LDL- this is concerning for the possibility of developing heart disease but she is having no cardiac symptoms whatsoever and when we do a risk stratification her risk for heart disease comes out at 2.9% therefore medication is not indicated instead dietary measures recommended in healthy living  Patient does have intermittent muscle and joint discomforts and achiness although not severe we talked about the possibility of doing blood work and even consideration of Cymbalta patient defers on this currently-if the symptoms get worse we would want to do some lab work as well as further follow-up and possibility of medications

## 2018-03-22 ENCOUNTER — Encounter

## 2018-04-07 DIAGNOSIS — H9201 Otalgia, right ear: Secondary | ICD-10-CM | POA: Insufficient documentation

## 2018-04-07 DIAGNOSIS — G43909 Migraine, unspecified, not intractable, without status migrainosus: Secondary | ICD-10-CM | POA: Insufficient documentation

## 2018-05-03 ENCOUNTER — Ambulatory Visit: Payer: Commercial Managed Care - PPO | Admitting: Family Medicine

## 2018-05-03 ENCOUNTER — Encounter: Payer: Self-pay | Admitting: Family Medicine

## 2018-05-03 ENCOUNTER — Ambulatory Visit (INDEPENDENT_AMBULATORY_CARE_PROVIDER_SITE_OTHER): Payer: Commercial Managed Care - PPO | Admitting: Family Medicine

## 2018-05-03 VITALS — BP 118/78 | Ht 66.0 in | Wt 148.2 lb

## 2018-05-03 DIAGNOSIS — R079 Chest pain, unspecified: Secondary | ICD-10-CM

## 2018-05-03 DIAGNOSIS — F418 Other specified anxiety disorders: Secondary | ICD-10-CM | POA: Diagnosis not present

## 2018-05-03 DIAGNOSIS — Z13220 Encounter for screening for lipoid disorders: Secondary | ICD-10-CM | POA: Diagnosis not present

## 2018-05-03 MED ORDER — BUPROPION HCL ER (XL) 300 MG PO TB24: 300.0000 mg | ORAL_TABLET | Freq: Every day | ORAL | 1 refills | Status: DC

## 2018-05-03 NOTE — Progress Notes (Signed)
   Subjective:    Patient ID: Madison Reyes, female    DOB: 1961/07/09, 57 y.o.   MRN: [id number]  HPI  Pt here today for 6 month follow up on depression. Well-controlled, no side effects of medication, would like to continue with 90 day supply. PHQ- 9 improved since last visit.  Reports pressure in middle of chest only when she is lying down or turning from side to side. Has been going on since June. No pain when standing, sitting, or with physical activity.  No shortness of breath, N/V, or pain in neck or arm.  No reflux symptoms or heartburn. Father had a hx of angina.    Review of Systems  Constitutional: Negative for chills, fatigue, fever and unexpected weight change.  Respiratory: Negative for shortness of breath.   Cardiovascular: Negative for palpitations and leg swelling.  Gastrointestinal: Negative for nausea and vomiting.  Musculoskeletal: Negative for neck pain.  Psychiatric/Behavioral: Negative for suicidal ideas.  All other systems reviewed and are negative.      Objective:   Physical Exam  Constitutional: She is oriented to person, place, and time. She appears well-developed and well-nourished. No distress.  HENT:  Head: Normocephalic and atraumatic.  Eyes: Right eye exhibits no discharge. Left eye exhibits no discharge.  Neck: Neck supple.  Cardiovascular: Normal rate, regular rhythm and normal heart sounds.  No murmur heard. Pulmonary/Chest: Effort normal and breath sounds normal. No respiratory distress.  Musculoskeletal: She exhibits no edema or tenderness (no chest wall tenderness).  Lymphadenopathy:    She has no cervical adenopathy.  Neurological: She is alert and oriented to person, place, and time.  Skin: Skin is warm and dry.  Psychiatric: She has a normal mood and affect.  Vitals reviewed.      Assessment & Plan:  1. Depression with anxiety: well-controlled on current dose of wellbutrin. Will continue. Refills given.   2. Chest pain, unspecified  type: Unlikely cardiac in nature. EKG done today and reviewed with Dr. Nicki Reaper and appears normal with no acute changes, compared to her last EKG done in 2012. Pt will get CXR done at her convenience. ASCVD risk score 3.2% based on last lipid profile done in March 2019 so no need for statin therapy at this time, however will repeat lipid panel. Warning signs discussed and pt understands when to seek emergency treatment. We will call her with results of CXR and lipid panel. There are certainly other potential causes Chest x-ray will help look at heart silhouette and mediastinum Unlikely to be reflux but that is a possibility but currently will not start PPI As attending physician to this patient visit, this patient was seen in conjunction with the nurse practitioner.  The history,physical and treatment plan was reviewed with the nurse practitioner and pertinent findings were verified with the patient.  Also the treatment plan was reviewed with the patient while they were present.   She will f/u in 6 months for depression.

## 2018-05-09 ENCOUNTER — Encounter: Payer: Self-pay | Admitting: Family Medicine

## 2018-06-15 ENCOUNTER — Ambulatory Visit (INDEPENDENT_AMBULATORY_CARE_PROVIDER_SITE_OTHER): Payer: Commercial Managed Care - PPO | Admitting: Neurology

## 2018-06-15 ENCOUNTER — Encounter: Payer: Self-pay | Admitting: Neurology

## 2018-06-15 ENCOUNTER — Encounter

## 2018-06-15 VITALS — BP 109/68 | HR 66 | Ht 67.0 in | Wt 148.0 lb

## 2018-06-15 DIAGNOSIS — H9192 Unspecified hearing loss, left ear: Secondary | ICD-10-CM | POA: Insufficient documentation

## 2018-06-15 DIAGNOSIS — G43709 Chronic migraine without aura, not intractable, without status migrainosus: Secondary | ICD-10-CM | POA: Diagnosis not present

## 2018-06-15 DIAGNOSIS — R51 Headache with orthostatic component, not elsewhere classified: Secondary | ICD-10-CM

## 2018-06-15 DIAGNOSIS — H539 Unspecified visual disturbance: Secondary | ICD-10-CM | POA: Diagnosis not present

## 2018-06-15 DIAGNOSIS — G8929 Other chronic pain: Secondary | ICD-10-CM

## 2018-06-15 DIAGNOSIS — R519 Headache, unspecified: Secondary | ICD-10-CM

## 2018-06-15 NOTE — Progress Notes (Signed)
[id number]    Provider:  Dr Jaynee Eagles Referring Provider: Izora Gala MD Primary Care Physician:  Kathyrn Drown, MD  CC:  Migraines  HPI:  Madison Reyes is a 57 y.o. female here as requested by Dr. Constance Holster for migraine. PMHx depression and anxiety, lumbar stenosis s/p lumbar laminectomy. She has had headaches for years, started worsening this past May she thought it was allergies and pseudephrine helps, tylenol, 2-3x a week. Headaches 2-3x a week. It is pressure in the front of the eyes, like a balloon inside her head, lots of pressure, more on the right, throbbing, nausea, +phonophobia, movement makes it worse, laying down and sleeping helps, they can last a few hours and sleeping helps they would last 24 hours or more. No FHx of migraines. No aura. 15 headache days a month, 4 days are migrainous. She wakes with the headaches, they can be positional. Also ear pain and hearing loss left ear. She has blurry vision and hearing loss in the left ear. Mostly nausea. Headaches wake her up. She was told she may have a tumor in the left ear. No other focal neurologic deficits, associated symptoms, inciting events or modifiable factors.  meds tried: Gabapentin, diclofenac, ibuprofen, naproxen, Wellbutrin, Tylenol, flexeril  Reviewed notes, labs and imaging from outside physicians, which showed:  Reviewed referring physician notes Dr. Arville Care.  Patient was seen for ear pain on the right.  No noticeable change in her hearing.  Husband was concerned she had hearing loss her PCP diagnosed her with an ear infection and treated her with antibiotics.  Audiologist provided hearing evaluation, tympanograms were normal bilaterally, there was a high-frequency hearing loss in the left were bone-conduction was worse than air conduction.  Also experiencing severe headaches recently, sleeping helps, decongestant medication seems to help.  She complains of chronic sinus pain and pressure but has no  difficulty breathing through her nose and only has occasional clear nasal discharge.  Examination appeared normal including ear canals, tympanic membranes, hearing grossly normal, nasal cavities clear with healthy mucosa no polyps or exudates.  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  No evidence of middle ear disease.  Referred to neurology for headache and referred ear pain possibly due to migraine variant.  tsh 2.21, hgba1c 5  Review of Systems: Patient complains of symptoms per HPI as well as the following symptoms: headache, insomnia. Pertinent negatives and positives per HPI. All others negative.   Social History   Socioeconomic History  . Marital status: Married    Spouse name: Not on file  . Number of children: 1  . Years of education: Not on file  . Highest education level: Bachelor's degree (e.g., BA, AB, BS)  Occupational History    Employer: [employer]  Social Needs  . Financial resource strain: Not on file  . Food insecurity:    Worry: Not on file    Inability: Not on file  . Transportation needs:    Medical: Not on file    Non-medical: Not on file  Tobacco Use  . Smoking status: Never Smoker  . Smokeless tobacco: Never Used  Substance and Sexual Activity  . Alcohol use: Never    Frequency: Never  . Drug use: Never  . Sexual activity: Yes    Birth control/protection: None    Comment: no cycles; pt had hysterectomy  Lifestyle  . Physical activity:    Days per week: Not on file    Minutes per  session: Not on file  . Stress: Not on file  Relationships  . Social connections:    Talks on phone: Not on file    Gets together: Not on file    Attends religious service: Not on file    Active member of club or organization: Not on file    Attends meetings of clubs or organizations: Not on file    Relationship status: Not on file  . Intimate partner violence:    Fear of current or ex partner: Not on file     Emotionally abused: Not on file    Physically abused: Not on file    Forced sexual activity: Not on file  Other Topics Concern  . Not on file  Social History Narrative   Lives at home with husband Ricky   Caffeine: none    Family History  Problem Relation Age of Onset  . Stroke Paternal Grandfather   . Pneumonia Paternal Grandmother   . Cancer Father        bone marrow cancer  . Prostate cancer Father   . COPD Father   . Heart disease Father   . Dementia Mother   . Hypertension Mother   . Heart disease Mother   . Cancer Sister        uterine  . Breast cancer Neg Hx     Past Medical History:  Diagnosis Date  . Anxiety   . Fibroids    h/o  . H/O dysmenorrhea   . H/O menorrhagia   . High cholesterol   . Menopausal symptoms    h/o  . Migraine     Past Surgical History:  Procedure Laterality Date  . ABDOMINAL HYSTERECTOMY    . BACK SURGERY  2015   disc removal  . LIPOMA EXCISION    . MYOMECTOMY    . ovarian fibroid    . UTERINE FIBROID SURGERY      Current Outpatient Medications  Medication Sig Dispense Refill  . Acetaminophen (TYLENOL PO) Take by mouth as needed.    Marland Kitchen buPROPion (WELLBUTRIN XL) 300 MG 24 hr tablet Take 1 tablet (300 mg total) by mouth daily. 90 tablet 1  . CALCIUM PO Take by mouth.    . Cyanocobalamin (B-12 PO) Take by mouth.    . Ibuprofen (ADVIL PO) Take by mouth as needed.    . MELOXICAM PO Take by mouth as needed (joint pain).    . Multiple Vitamins-Minerals (MULTIVITAMIN PO) Take by mouth.    . Omega-3 Fatty Acids (FISH OIL PO) Take by mouth.    Marland Kitchen OVER THE COUNTER MEDICATION as needed. Sinus meds: Acetaminophen 325 mg Dextromethorphan 10 mg Guafenesin 200 mg Phenylephrine 5 mg    . thyroid (ARMOUR) 15 MG tablet Take 15 mg by mouth daily.     No current facility-administered medications for this visit.     Allergies as of 06/15/2018 - Review Complete 06/15/2018  Allergen Reaction Noted  . Aspirin  05/06/2011    Vitals: BP  109/68 (BP Location: Right Arm, Patient Position: Sitting)   Pulse 66   Ht 5\' 7"  (1.702 m)   Wt 148 lb (67.1 kg)   BMI 23.18 kg/m  Last Weight:  Wt Readings from Last 1 Encounters:  06/15/18 148 lb (67.1 kg)   Last Height:   Ht Readings from Last 1 Encounters:  06/15/18 5\' 7"  (1.702 m)   Physical exam: Exam: Gen: NAD, conversant, well nourised, well groomed  CV: RRR, no MRG. No Carotid Bruits. No peripheral edema, warm, nontender Eyes: Conjunctivae clear without exudates or hemorrhage  Neuro: Detailed Neurologic Exam  Speech:    Speech is normal; fluent and spontaneous with normal comprehension.  Cognition:    The patient is oriented to person, place, and time;     recent and remote memory intact;     language fluent;     normal attention, concentration,     fund of knowledge Cranial Nerves:    The pupils are equal, round, and reactive to light. The fundi are normal and spontaneous venous pulsations are present. Visual fields are full to finger confrontation. Extraocular movements are intact. Trigeminal sensation is intact and the muscles of mastication are normal. The face is symmetric. The palate elevates in the midline. Hearing loss left ear. Voice is normal. Shoulder shrug is normal. The tongue has normal motion without fasciculations.   Coordination:    Normal finger to nose and heel to shin. Normal rapid alternating movements.   Gait:    Heel-toe and tandem gait are normal.   Motor Observation:    No asymmetry, no atrophy, and no involuntary movements noted. Tone:    Normal muscle tone.    Posture:    Posture is normal. normal erect    Strength:    Strength is V/V in the upper and lower limbs.      Sensation: intact to LT     Reflex Exam:  DTR's:    Deep tendon reflexes in the upper and lower extremities are normal bilaterally.   Toes:    The toes are downgoing bilaterally.   Clonus:    Clonus is absent.       Assessment/Plan:   57 year old with chronic migraines but needs a thorough evaluation due to several concerning symptoms as below:  MRI brain due to concerning symptoms of morning headaches, positional headaches,vision changes, left hearing loss  to look for space occupying mass, chiari or intracranial hypertension (pseudotumor), schwannoma or compressive tumor.    Discussed migraine management at length, she will consider medication and RTC 4 weeks  Orders Placed This Encounter  Procedures  . MR BRAIN W WO CONTRAST  . Basic Metabolic Panel     Discussed: To prevent or relieve headaches, try the following: Cool Compress. Lie down and place a cool compress on your head.  Avoid headache triggers. If certain foods or odors seem to have triggered your migraines in the past, avoid them. A headache diary might help you identify triggers.  Include physical activity in your daily routine. Try a daily walk or other moderate aerobic exercise.  Manage stress. Find healthy ways to cope with the stressors, such as delegating tasks on your to-do list.  Practice relaxation techniques. Try deep breathing, yoga, massage and visualization.  Eat regularly. Eating regularly scheduled meals and maintaining a healthy diet might help prevent headaches. Also, drink plenty of fluids.  Follow a regular sleep schedule. Sleep deprivation might contribute to headaches Consider biofeedback. With this mind-body technique, you learn to control certain bodily functions - such as muscle tension, heart rate and blood pressure - to prevent headaches or reduce headache pain.    Proceed to emergency room if you experience new or worsening symptoms or symptoms do not resolve, if you have new neurologic symptoms or if headache is severe, or for any concerning symptom.   Provided education and documentation from American headache Society toolbox including articles on: chronic migraine medication overuse headache,  chronic migraines, prevention of  migraines, behavioral and other nonpharmacologic treatments for headache.     Cc: Izora Gala MD and Dr. Sallee Lange   Sarina Ill, MD  [facility] [address] [place], [place] [address]  Phone [phone] Fax [phone]

## 2018-06-15 NOTE — Patient Instructions (Signed)
MRI of the brain   Migraine Headache A migraine headache is an intense, throbbing pain on one side or both sides of the head. Migraines may also cause other symptoms, such as nausea, vomiting, and sensitivity to light and noise. What are the causes? Doing or taking certain things may also trigger migraines, such as:  Alcohol.  Smoking.  Medicines, such as: ? Medicine used to treat chest pain (nitroglycerine). ? Birth control pills. ? Estrogen pills. ? Certain blood pressure medicines.  Aged cheeses, chocolate, or caffeine.  Foods or drinks that contain nitrates, glutamate, aspartame, or tyramine.  Physical activity.  Other things that may trigger a migraine include:  Menstruation.  Pregnancy.  Hunger.  Stress, lack of sleep, too much sleep, or fatigue.  Weather changes.  What increases the risk? The following factors may make you more likely to experience migraine headaches:  Age. Risk increases with age.  Family history of migraine headaches.  Being Caucasian.  Depression and anxiety.  Obesity.  Being a woman.  Having a hole in the heart (patent foramen ovale) or other heart problems.  What are the signs or symptoms? The main symptom of this condition is pulsating or throbbing pain. Pain may:  Happen in any area of the head, such as on one side or both sides.  Interfere with daily activities.  Get worse with physical activity.  Get worse with exposure to bright lights or loud noises.  Other symptoms may include:  Nausea.  Vomiting.  Dizziness.  General sensitivity to bright lights, loud noises, or smells.  Before you get a migraine, you may get warning signs that a migraine is developing (aura). An aura may include:  Seeing flashing lights or having blind spots.  Seeing bright spots, halos, or zigzag lines.  Having tunnel vision or blurred vision.  Having numbness or a tingling feeling.  Having trouble talking.  Having muscle  weakness.  How is this diagnosed? A migraine headache can be diagnosed based on:  Your symptoms.  A physical exam.  Tests, such as CT scan or MRI of the head. These imaging tests can help rule out other causes of headaches.  Taking fluid from the spine (lumbar puncture) and analyzing it (cerebrospinal fluid analysis, or CSF analysis).  How is this treated? A migraine headache is usually treated with medicines that:  Relieve pain.  Relieve nausea.  Prevent migraines from coming back.  Treatment may also include:  Acupuncture.  Lifestyle changes like avoiding foods that trigger migraines.  Follow these instructions at home: Medicines  Take over-the-counter and prescription medicines only as told by your health care provider.  Do not drive or use heavy machinery while taking prescription pain medicine.  To prevent or treat constipation while you are taking prescription pain medicine, your health care provider may recommend that you: ? Drink enough fluid to keep your urine clear or pale yellow. ? Take over-the-counter or prescription medicines. ? Eat foods that are high in fiber, such as fresh fruits and vegetables, whole grains, and beans. ? Limit foods that are high in fat and processed sugars, such as fried and sweet foods. Lifestyle  Avoid alcohol use.  Do not use any products that contain nicotine or tobacco, such as cigarettes and e-cigarettes. If you need help quitting, ask your health care provider.  Get at least 8 hours of sleep every night.  Limit your stress. General instructions   Keep a journal to find out what may trigger your migraine headaches. For example, write  down: ? What you eat and drink. ? How much sleep you get. ? Any change to your diet or medicines.  If you have a migraine: ? Avoid things that make your symptoms worse, such as bright lights. ? It may help to lie down in a dark, quiet room. ? Do not drive or use heavy machinery. ? Ask  your health care provider what activities are safe for you while you are experiencing symptoms.  Keep all follow-up visits as told by your health care provider. This is important. Contact a health care provider if:  You develop symptoms that are different or more severe than your usual migraine symptoms. Get help right away if:  Your migraine becomes severe.  You have a fever.  You have a stiff neck.  You have vision loss.  Your muscles feel weak or like you cannot control them.  You start to lose your balance often.  You develop trouble walking.  You faint. This information is not intended to replace advice given to you by your health care provider. Make sure you discuss any questions you have with your health care provider. Document Released: 08/09/2005 Document Revised: 02/27/2016 Document Reviewed: 01/26/2016 Elsevier Interactive Patient Education  2017 [employer].

## 2018-06-16 ENCOUNTER — Telehealth: Payer: Self-pay | Admitting: *Deleted

## 2018-06-16 LAB — BASIC METABOLIC PANEL
BUN/Creatinine Ratio: 11 (ref 9–23)
BUN: 10 mg/dL (ref 6–24)
CALCIUM: 9.4 mg/dL (ref 8.7–10.2)
CHLORIDE: 104 mmol/L (ref 96–106)
CO2: 23 mmol/L (ref 20–29)
Creatinine, Ser: 0.88 mg/dL (ref 0.57–1.00)
GFR calc Af Amer: 85 mL/min/{1.73_m2} (ref >59)
GFR calc non Af Amer: 74 mL/min/{1.73_m2} (ref >59)
GLUCOSE: 62 mg/dL — AB (ref 65–99)
POTASSIUM: 3.6 mmol/L (ref 3.5–5.2)
Sodium: 141 mmol/L (ref 134–144)

## 2018-06-16 NOTE — Telephone Encounter (Signed)
Spoke with pt and informed her that her labs were unremarkable, her glucose was a little low though at 62. Pt stated she had not eaten anything for awhile before the appt. She denied any known h/o hypoglycemia. Advised pt just to keep an eye on her intake, reviewed some s/s of hypoglycemia and advised to contact PCP with any concerns. She verbalized appreciation and understanding.

## 2018-06-16 NOTE — Telephone Encounter (Signed)
-----   Message from Melvenia Beam, MD sent at 06/16/2018 10:41 AM EDT ----- Labs unremarkable, her glucose is a little low though at 62 thanks

## 2018-06-20 ENCOUNTER — Other Ambulatory Visit: Payer: Self-pay | Admitting: Neurology

## 2018-06-20 ENCOUNTER — Telehealth: Payer: Self-pay | Admitting: Neurology

## 2018-06-20 MED ORDER — ALPRAZOLAM 0.5 MG PO TABS: ORAL_TABLET | ORAL | 0 refills | Status: DC

## 2018-06-20 NOTE — Telephone Encounter (Signed)
Patient returned my call she is scheduled for 06/28/18 at [facility]. She also informed me she is claustrophobic and would like something to help her.

## 2018-06-20 NOTE — Telephone Encounter (Signed)
Done, sent to [facility]. thanks

## 2018-06-20 NOTE — Telephone Encounter (Signed)
lvm for pt to call back about scheduling mri  [facility] Auth: [id number] (exp. 06/19/18 to 07/18/18)

## 2018-06-28 ENCOUNTER — Ambulatory Visit: Payer: Commercial Managed Care - PPO

## 2018-06-28 DIAGNOSIS — R51 Headache with orthostatic component, not elsewhere classified: Secondary | ICD-10-CM

## 2018-06-28 DIAGNOSIS — H539 Unspecified visual disturbance: Secondary | ICD-10-CM | POA: Diagnosis not present

## 2018-06-28 DIAGNOSIS — G8929 Other chronic pain: Secondary | ICD-10-CM

## 2018-06-28 DIAGNOSIS — H9192 Unspecified hearing loss, left ear: Secondary | ICD-10-CM | POA: Diagnosis not present

## 2018-06-28 DIAGNOSIS — R519 Headache, unspecified: Secondary | ICD-10-CM

## 2018-06-28 MED ORDER — GADOBENATE DIMEGLUMINE 529 MG/ML IV SOLN
15.0000 mL | Freq: Once | INTRAVENOUS | Status: AC | PRN
  Administered 2018-06-28: 15 mL via INTRAVENOUS

## 2018-06-30 ENCOUNTER — Telehealth: Payer: Self-pay | Admitting: *Deleted

## 2018-06-30 NOTE — Telephone Encounter (Signed)
-----   Message from Melvenia Beam, MD sent at 06/29/2018  7:35 PM EST ----- MRI of the brain is unremarkable, good news! thanks

## 2018-06-30 NOTE — Telephone Encounter (Signed)
Called Madison Reyes and LVM (ok per DPR) informing her that Dr. Jaynee Eagles said her MRI brain is unremarkable and that is good news. Left office number and hours in the message in case Madison Reyes has any questions. Informed her that a call back is not required.

## 2018-09-12 ENCOUNTER — Other Ambulatory Visit: Payer: Self-pay | Admitting: Family Medicine

## 2018-09-12 DIAGNOSIS — Z1231 Encounter for screening mammogram for malignant neoplasm of breast: Secondary | ICD-10-CM

## 2018-10-23 ENCOUNTER — Ambulatory Visit
Admission: RE | Admit: 2018-10-23 | Discharge: 2018-10-23 | Disposition: A | Discharge status: Home / Self Care | Payer: Commercial Managed Care - PPO | Source: Ambulatory Visit | Attending: Family Medicine | Admitting: Family Medicine

## 2018-10-23 DIAGNOSIS — Z029 Encounter for administrative examinations, unspecified: Secondary | ICD-10-CM

## 2018-10-23 DIAGNOSIS — Z1231 Encounter for screening mammogram for malignant neoplasm of breast: Secondary | ICD-10-CM

## 2018-11-17 ENCOUNTER — Other Ambulatory Visit: Payer: Self-pay | Admitting: Family Medicine

## 2018-12-11 ENCOUNTER — Telehealth: Payer: Self-pay | Admitting: *Deleted

## 2018-12-11 NOTE — Telephone Encounter (Signed)
Received refill request for Xanax. However Dr. Jaynee Eagles only gave pt 6 tablets for MRI in October 2019. Pt got Xanax once in 2016 by PCP per medication history in chart. Refill denied and faxed back to [facility]. Received a receipt of confirmation.

## 2019-02-16 ENCOUNTER — Other Ambulatory Visit: Payer: Self-pay | Admitting: Otolaryngology

## 2019-02-16 DIAGNOSIS — H9201 Otalgia, right ear: Secondary | ICD-10-CM

## 2019-02-16 DIAGNOSIS — IMO0001 Reserved for inherently not codable concepts without codable children: Secondary | ICD-10-CM

## 2019-02-16 DIAGNOSIS — H918X2 Other specified hearing loss, left ear: Secondary | ICD-10-CM

## 2019-03-05 ENCOUNTER — Other Ambulatory Visit: Payer: Self-pay

## 2019-03-05 DIAGNOSIS — Z20822 Contact with and (suspected) exposure to covid-19: Secondary | ICD-10-CM

## 2019-03-09 LAB — NOVEL CORONAVIRUS, NAA: SARS-CoV-2, NAA: NOT DETECTED

## 2019-04-03 ENCOUNTER — Other Ambulatory Visit: Payer: Self-pay

## 2019-04-03 ENCOUNTER — Encounter

## 2019-04-03 ENCOUNTER — Ambulatory Visit (INDEPENDENT_AMBULATORY_CARE_PROVIDER_SITE_OTHER): Payer: Commercial Managed Care - PPO | Admitting: Podiatry

## 2019-04-03 ENCOUNTER — Encounter: Payer: Self-pay | Admitting: Podiatry

## 2019-04-03 DIAGNOSIS — B351 Tinea unguium: Secondary | ICD-10-CM

## 2019-04-03 DIAGNOSIS — N952 Postmenopausal atrophic vaginitis: Secondary | ICD-10-CM | POA: Insufficient documentation

## 2019-04-03 DIAGNOSIS — R5383 Other fatigue: Secondary | ICD-10-CM | POA: Insufficient documentation

## 2019-04-03 DIAGNOSIS — R81 Glycosuria: Secondary | ICD-10-CM | POA: Insufficient documentation

## 2019-04-03 DIAGNOSIS — E785 Hyperlipidemia, unspecified: Secondary | ICD-10-CM | POA: Insufficient documentation

## 2019-04-03 DIAGNOSIS — N951 Menopausal and female climacteric states: Secondary | ICD-10-CM | POA: Insufficient documentation

## 2019-04-03 DIAGNOSIS — R350 Frequency of micturition: Secondary | ICD-10-CM | POA: Insufficient documentation

## 2019-04-03 DIAGNOSIS — E7800 Pure hypercholesterolemia, unspecified: Secondary | ICD-10-CM | POA: Insufficient documentation

## 2019-04-03 DIAGNOSIS — R300 Dysuria: Secondary | ICD-10-CM | POA: Insufficient documentation

## 2019-04-03 DIAGNOSIS — N393 Stress incontinence (female) (male): Secondary | ICD-10-CM | POA: Insufficient documentation

## 2019-04-03 NOTE — Patient Instructions (Signed)

## 2019-04-19 ENCOUNTER — Telehealth: Payer: Self-pay | Admitting: Neurology

## 2019-04-19 NOTE — Telephone Encounter (Signed)
Patient called stating that she is having a migraine and needs something to help her.

## 2019-04-19 NOTE — Telephone Encounter (Signed)
Returned pt's call and LVM (ok per DPR) asking for call back to discuss and also advised a f/u with the NP as she hasn't been seen since October 2019. I also advised we have no current openings today so she could go to PCP if she would like for the headache. Asked for call back to discuss if we can help in the meantime. Left office number in message.

## 2019-04-19 NOTE — Telephone Encounter (Signed)
Patient called back she states she really just wants to see Dr. Jaynee Eagles so she is scheduled for 05/21/19 to see Dr. Jaynee Eagles she stated she does not need a call back. But she wanted to be added to the cancellation list so I did that.

## 2019-05-09 ENCOUNTER — Encounter: Payer: Self-pay | Admitting: Family Medicine

## 2019-05-09 ENCOUNTER — Other Ambulatory Visit: Payer: Self-pay

## 2019-05-09 ENCOUNTER — Ambulatory Visit (INDEPENDENT_AMBULATORY_CARE_PROVIDER_SITE_OTHER): Payer: Commercial Managed Care - PPO | Admitting: Family Medicine

## 2019-05-09 VITALS — BP 122/70 | Temp 98.1°F | Ht 65.5 in | Wt 155.0 lb

## 2019-05-09 DIAGNOSIS — Z114 Encounter for screening for human immunodeficiency virus [HIV]: Secondary | ICD-10-CM | POA: Diagnosis not present

## 2019-05-09 DIAGNOSIS — Z1159 Encounter for screening for other viral diseases: Secondary | ICD-10-CM | POA: Diagnosis not present

## 2019-05-09 DIAGNOSIS — Z0000 Encounter for general adult medical examination without abnormal findings: Secondary | ICD-10-CM | POA: Diagnosis not present

## 2019-05-09 DIAGNOSIS — E7800 Pure hypercholesterolemia, unspecified: Secondary | ICD-10-CM

## 2019-05-09 DIAGNOSIS — B351 Tinea unguium: Secondary | ICD-10-CM

## 2019-05-09 NOTE — Progress Notes (Signed)
   Subjective:    Patient ID: Madison Reyes, female    DOB: [birth year], 58 y.o.   MRN: [id number] Very nice patient here today for wellness exam HPI The patient comes in today for a wellness visit. Patient doing the best she can to eating healthy staying physically active Sees gyn for pap.  Declines flu vaccine.   A review of their health history was completed.  A review of medications was also completed.  Any needed refills;  She had a colonoscopy previous next 08/2022 She gets her mammogram on a regular basis Gets her Pap smear through her gynecologist Eating habits: health conscious  Falls/  MVA accidents in past few months: none  Regular exercise: walking and weights  Specialist pt sees on regular basis: none  Preventative health issues were discussed.   Additional concerns: would like to discuss coming off of wellbutrin, would like right thumb nail removed.   Patient denies any drinking issues.  Denies being depressed.  She is interested in coming off of Wellbutrin.  We did discuss this.  We will reduce it to 150 mg if that goes well over the next month she will stop the medicine she will let us know if she has any reoccurring symptoms  Review of Systems  Constitutional: Negative for activity change, appetite change and fatigue.  HENT: Negative for congestion and rhinorrhea.   Respiratory: Negative for cough and shortness of breath.   Cardiovascular: Negative for chest pain and leg swelling.  Gastrointestinal: Negative for abdominal pain and diarrhea.  Endocrine: Negative for polydipsia and polyphagia.  Skin: Negative for color change.  Neurological: Negative for dizziness and weakness.  Psychiatric/Behavioral: Negative for behavioral problems and confusion.       Objective:   Physical Exam Vitals signs reviewed.  Constitutional:      General: She is not in acute distress. HENT:     Head: Normocephalic and atraumatic.  Eyes:     General:        Right eye: No  discharge.        Left eye: No discharge.  Neck:     Trachea: No tracheal deviation.  Cardiovascular:     Rate and Rhythm: Normal rate and regular rhythm.     Heart sounds: Normal heart sounds. No murmur.  Pulmonary:     Effort: Pulmonary effort is normal. No respiratory distress.     Breath sounds: Normal breath sounds.  Lymphadenopathy:     Cervical: No cervical adenopathy.  Skin:    General: Skin is warm and dry.  Neurological:     Mental Status: She is alert.     Coordination: Coordination normal.  Psychiatric:        Behavior: Behavior normal.    Patient does have a mildly separated thumbnail on the distal aspect.       Assessment & Plan:  Adult wellness-complete.wellness physical was conducted today. Importance of diet and exercise were discussed in detail.  In addition to this a discussion regarding safety was also covered. We also reviewed over immunizations and gave recommendations regarding current immunization needed for age.  In addition to this additional areas were also touched on including: Preventative health exams needed:  Colonoscopy 2024  Patient was advised yearly wellness exam  Patient relates that previous thumb injury the nail never grew back in it separates easily and gets whitened at times I recommend nail culture if it comes back positive for fungus we will treat

## 2019-05-15 LAB — LIPID PANEL
Chol/HDL Ratio: 4.5 ratio — ABNORMAL HIGH (ref 0.0–4.4)
Cholesterol, Total: 290 mg/dL — ABNORMAL HIGH (ref 100–199)
HDL: 65 mg/dL (ref >39)
LDL Chol Calc (NIH): 207 mg/dL — ABNORMAL HIGH (ref 0–99)
Triglycerides: 105 mg/dL (ref 0–149)
VLDL Cholesterol Cal: 18 mg/dL (ref 5–40)

## 2019-05-15 LAB — BASIC METABOLIC PANEL
BUN/Creatinine Ratio: 13 (ref 9–23)
BUN: 12 mg/dL (ref 6–24)
CO2: 23 mmol/L (ref 20–29)
Calcium: 9.8 mg/dL (ref 8.7–10.2)
Chloride: 102 mmol/L (ref 96–106)
Creatinine, Ser: 0.91 mg/dL (ref 0.57–1.00)
GFR calc Af Amer: 81 mL/min/{1.73_m2} (ref >59)
GFR calc non Af Amer: 70 mL/min/{1.73_m2} (ref >59)
Glucose: 79 mg/dL (ref 65–99)
Potassium: 3.9 mmol/L (ref 3.5–5.2)
Sodium: 140 mmol/L (ref 134–144)

## 2019-05-15 LAB — HIV ANTIBODY (ROUTINE TESTING W REFLEX): HIV Screen 4th Generation wRfx: NONREACTIVE

## 2019-05-15 LAB — HEPATITIS C ANTIBODY: Hep C Virus Ab: <0.1 s/co ratio (ref 0.0–0.9)

## 2019-05-17 ENCOUNTER — Encounter: Payer: Self-pay | Admitting: Family Medicine

## 2019-05-21 ENCOUNTER — Other Ambulatory Visit: Payer: Self-pay

## 2019-05-21 ENCOUNTER — Encounter

## 2019-05-21 ENCOUNTER — Telehealth: Payer: Self-pay | Admitting: Family Medicine

## 2019-05-21 ENCOUNTER — Ambulatory Visit (INDEPENDENT_AMBULATORY_CARE_PROVIDER_SITE_OTHER): Payer: Commercial Managed Care - PPO | Admitting: Neurology

## 2019-05-21 ENCOUNTER — Encounter: Payer: Self-pay | Admitting: Neurology

## 2019-05-21 VITALS — BP 116/66 | HR 76 | Temp 97.8°F | Wt 161.0 lb

## 2019-05-21 DIAGNOSIS — R29898 Other symptoms and signs involving the musculoskeletal system: Secondary | ICD-10-CM | POA: Diagnosis not present

## 2019-05-21 DIAGNOSIS — G43709 Chronic migraine without aura, not intractable, without status migrainosus: Secondary | ICD-10-CM | POA: Diagnosis not present

## 2019-05-21 DIAGNOSIS — M79604 Pain in right leg: Secondary | ICD-10-CM | POA: Diagnosis not present

## 2019-05-21 DIAGNOSIS — R202 Paresthesia of skin: Secondary | ICD-10-CM | POA: Diagnosis not present

## 2019-05-21 MED ORDER — BUPROPION HCL ER (XL) 150 MG PO TB24: 150.0000 mg | ORAL_TABLET | Freq: Every day | ORAL | 0 refills | Status: DC

## 2019-05-21 MED ORDER — AJOVY 225 MG/1.5ML ~~LOC~~ SOAJ: 225.0000 mg | SUBCUTANEOUS | 11 refills | Status: DC

## 2019-05-21 NOTE — Patient Instructions (Addendum)
Monthly Ajovy injections; preventative Acute: Rizatriptan(Maxalt) Please take one tablet at the onset of your headache. If it does not improve the symptoms please take one additional tablet. Do not take more then 2 tablets in 24hrs. Do not take use more then 2 to 3 times in a week. Nausea: Ondansetron(Zofran)   Fremanezumab injection What is this medicine? FREMANEZUMAB (fre ma NEZ ue mab) is used to prevent migraine headaches. This medicine may be used for other purposes; ask your health care provider or pharmacist if you have questions. COMMON BRAND NAME(S): AJOVY What should I tell my health care provider before I take this medicine? They need to know if you have any of these conditions:  an unusual or allergic reaction to fremanezumab, other medicines, foods, dyes, or preservatives  pregnant or trying to get pregnant  breast-feeding How should I use this medicine? This medicine is for injection under the skin. You will be taught how to prepare and give this medicine. Use exactly as directed. Take your medicine at regular intervals. Do not take your medicine more often than directed. It is important that you put your used needles and syringes in a special sharps container. Do not put them in a trash can. If you do not have a sharps container, call your pharmacist or healthcare provider to get one. Talk to your pediatrician regarding the use of this medicine in children. Special care may be needed. Overdosage: If you think you have taken too much of this medicine contact a poison control center or emergency room at once. NOTE: This medicine is only for you. Do not share this medicine with others. What if I miss a dose? If you miss a dose, take it as soon as you can. If it is almost time for your next dose, take only that dose. Do not take double or extra doses. What may interact with this medicine? Interactions are not expected. This list may not describe all possible interactions.  Give your health care provider a list of all the medicines, herbs, non-prescription drugs, or dietary supplements you use. Also tell them if you smoke, drink alcohol, or use illegal drugs. Some items may interact with your medicine. What should I watch for while using this medicine? Tell your doctor or healthcare professional if your symptoms do not start to get better or if they get worse. What side effects may I notice from receiving this medicine? Side effects that you should report to your doctor or health care professional as soon as possible:  allergic reactions like skin rash, itching or hives, swelling of the face, lips, or tongue Side effects that usually do not require medical attention (report these to your doctor or health care professional if they continue or are bothersome):  pain, redness, or irritation at site where injected This list may not describe all possible side effects. Call your doctor for medical advice about side effects. You may report side effects to FDA at 1-800-FDA-1088. Where should I keep my medicine? Keep out of the reach of children. You will be instructed on how to store this medicine. Throw away any unused medicine after the expiration date on the label. NOTE: This sheet is a summary. It may not cover all possible information. If you have questions about this medicine, talk to your doctor, pharmacist, or health care provider.  2020 Elsevier/Gold Standard (2017-05-09 17:22:56)  Ondansetron tablets What is this medicine? ONDANSETRON (on DAN se tron) is used to treat nausea and vomiting caused by chemotherapy. It  is also used to prevent or treat nausea and vomiting after surgery. This medicine may be used for other purposes; ask your health care provider or pharmacist if you have questions. COMMON BRAND NAME(S): Zofran What should I tell my health care provider before I take this medicine? They need to know if you have any of these conditions:  heart  disease  history of irregular heartbeat  liver disease  low levels of magnesium or potassium in the blood  an unusual or allergic reaction to ondansetron, granisetron, other medicines, foods, dyes, or preservatives  pregnant or trying to get pregnant  breast-feeding How should I use this medicine? Take this medicine by mouth with a glass of water. Follow the directions on your prescription label. Take your doses at regular intervals. Do not take your medicine more often than directed. Talk to your pediatrician regarding the use of this medicine in children. Special care may be needed. Overdosage: If you think you have taken too much of this medicine contact a poison control center or emergency room at once. NOTE: This medicine is only for you. Do not share this medicine with others. What if I miss a dose? If you miss a dose, take it as soon as you can. If it is almost time for your next dose, take only that dose. Do not take double or extra doses. What may interact with this medicine? Do not take this medicine with any of the following medications:  apomorphine  certain medicines for fungal infections like fluconazole, itraconazole, ketoconazole, posaconazole, voriconazole  cisapride  dronedarone  pimozide  thioridazine This medicine may also interact with the following medications:  carbamazepine  certain medicines for depression, anxiety, or psychotic disturbances  fentanyl  linezolid  MAOIs like Carbex, Eldepryl, Marplan, Nardil, and Parnate  methylene blue (injected into a vein)  other medicines that prolong the QT interval (cause an abnormal heart rhythm) like dofetilide, ziprasidone  phenytoin  rifampicin  tramadol This list may not describe all possible interactions. Give your health care provider a list of all the medicines, herbs, non-prescription drugs, or dietary supplements you use. Also tell them if you smoke, drink alcohol, or use illegal drugs.  Some items may interact with your medicine. What should I watch for while using this medicine? Check with your doctor or health care professional right away if you have any sign of an allergic reaction. What side effects may I notice from receiving this medicine? Side effects that you should report to your doctor or health care professional as soon as possible:  allergic reactions like skin rash, itching or hives, swelling of the face, lips or tongue  breathing problems  confusion  dizziness  fast or irregular heartbeat  feeling faint or lightheaded, falls  fever and chills  loss of balance or coordination  seizures  sweating  swelling of the hands or feet  tightness in the chest  tremors  unusually weak or tired Side effects that usually do not require medical attention (report to your doctor or health care professional if they continue or are bothersome):  constipation or diarrhea  headache This list may not describe all possible side effects. Call your doctor for medical advice about side effects. You may report side effects to FDA at 1-800-FDA-1088. Where should I keep my medicine? Keep out of the reach of children. Store between 2 and 30 degrees C (36 and 86 degrees F). Throw away any unused medicine after the expiration date. NOTE: This sheet is a summary.  It may not cover all possible information. If you have questions about this medicine, talk to your doctor, pharmacist, or health care provider.  2020 Elsevier/Gold Standard (2018-08-01 07:16:43)   Rizatriptan tablets What is this medicine? RIZATRIPTAN (rye za TRIP tan) is used to treat migraines with or without aura. An aura is a strange feeling or visual disturbance that warns you of an attack. It is not used to prevent migraines. This medicine may be used for other purposes; ask your health care provider or pharmacist if you have questions. COMMON BRAND NAME(S): Maxalt What should I tell my health care  provider before I take this medicine? They need to know if you have any of these conditions:  cigarette smoker  circulation problems in fingers and toes  diabetes  heart disease  high blood pressure  high cholesterol  history of irregular heartbeat  history of stroke  kidney disease  liver disease  stomach or intestine problems  an unusual or allergic reaction to rizatriptan, other medicines, foods, dyes, or preservatives  pregnant or trying to get pregnant  breast-feeding How should I use this medicine? Take this medicine by mouth with a glass of water. Follow the directions on the prescription label. Do not take it more often than directed. Talk to your pediatrician regarding the use of this medicine in children. While this drug may be prescribed for children as young as 6 years for selected conditions, precautions do apply. Overdosage: If you think you have taken too much of this medicine contact a poison control center or emergency room at once. NOTE: This medicine is only for you. Do not share this medicine with others. What if I miss a dose? This does not apply. This medicine is not for regular use. What may interact with this medicine? Do not take this medicine with any of the following medicines:  certain medicines for migraine headache like almotriptan, eletriptan, frovatriptan, naratriptan, rizatriptan, sumatriptan, zolmitriptan  ergot alkaloids like dihydroergotamine, ergonovine, ergotamine, methylergonovine  MAOIs like Carbex, Eldepryl, Marplan, Nardil, and Parnate This medicine may also interact with the following medications:  certain medicines for depression, anxiety, or psychotic disorders  propranolol This list may not describe all possible interactions. Give your health care provider a list of all the medicines, herbs, non-prescription drugs, or dietary supplements you use. Also tell them if you smoke, drink alcohol, or use illegal drugs. Some items  may interact with your medicine. What should I watch for while using this medicine? Visit your healthcare professional for regular checks on your progress. Tell your healthcare professional if your symptoms do not start to get better or if they get worse. You may get drowsy or dizzy. Do not drive, use machinery, or do anything that needs mental alertness until you know how this medicine affects you. Do not stand up or sit up quickly, especially if you are an older patient. This reduces the risk of dizzy or fainting spells. Alcohol may interfere with the effect of this medicine. Your mouth may get dry. Chewing sugarless gum or sucking hard candy and drinking plenty of water may help. Contact your healthcare professional if the problem does not go away or is severe. If you take migraine medicines for 10 or more days a month, your migraines may get worse. Keep a diary of headache days and medicine use. Contact your healthcare professional if your migraine attacks occur more frequently. What side effects may I notice from receiving this medicine? Side effects that you should report to your  doctor or health care professional as soon as possible:  allergic reactions like skin rash, itching or hives, swelling of the face, lips, or tongue  chest pain or chest tightness  signs and symptoms of a dangerous change in heartbeat or heart rhythm like chest pain; dizziness; fast, irregular heartbeat; palpitations; feeling faint or lightheaded; falls; breathing problems  signs and symptoms of a stroke like changes in vision; confusion; trouble speaking or understanding; severe headaches; sudden numbness or weakness of the face, arm or leg; trouble walking; dizziness; loss of balance or coordination  signs and symptoms of serotonin syndrome like irritable; confusion; diarrhea; fast or irregular heartbeat; muscle twitching; stiff muscles; trouble walking; sweating; high fever; seizures; chills; vomiting Side effects  that usually do not require medical attention (report to your doctor or health care professional if they continue or are bothersome):  diarrhea  dizziness  drowsiness  dry mouth  headache  nausea, vomiting  pain, tingling, numbness in the hands or feet  stomach pain This list may not describe all possible side effects. Call your doctor for medical advice about side effects. You may report side effects to FDA at 1-800-FDA-1088. Where should I keep my medicine? Keep out of the reach of children. Store at room temperature between 15 and 30 degrees C (59 and 86 degrees F). Keep container tightly closed. Throw away any unused medicine after the expiration date. NOTE: This sheet is a summary. It may not cover all possible information. If you have questions about this medicine, talk to your doctor, pharmacist, or health care provider.  2020 [employer]vier/Gold Standard (2018-02-21 14:59:59)

## 2019-05-21 NOTE — Progress Notes (Signed)
[facility]    Provider:  Dr Jaynee Eagles Primary Care Physician:  Kathyrn Drown, MD  CC:  Migraines and right leg pain  Interval history 9/28.2020: Her migraines worsening 6 months,  She has congestion and anti-histamine helps. She also has right leg pain, radiates into the groin on the right and into the thigh.Either hip or low back. She has lateral leg pain, points to the area of the hip, NSAIDs help.MRI lumbar spine. Referral to [facility] for right hip pain. She has been managing her leg pain for at least 6 months, failed conservative therapy (NSaids, heat,ice) and has been managed by PCP need MRI lumbar spine.  HPI:  Madison Reyes is a 58 y.o. female here as requested by Dr. Constance Holster for migraine. PMHx depression and anxiety, lumbar stenosis s/p lumbar laminectomy. She has had headaches for years, started worsening this past May she thought it was allergies and pseudephrine helps, tylenol, 2-3x a week. Headaches 2-3x a week. It is pressure in the front of the eyes, like a balloon inside her head, lots of pressure, more on the right, throbbing, nausea, +phonophobia, movement makes it worse, laying down and sleeping helps, they can last a few hours and sleeping helps they would last 24 hours or more. No FHx of migraines. No aura. 15 headache days a month, 4 days are migrainous. She wakes with the headaches, they can be positional. Also ear pain and hearing loss left ear. She has blurry vision and hearing loss in the left ear. Mostly nausea. Headaches wake her up. She was told she may have a tumor in the left ear. No other focal neurologic deficits, associated symptoms, inciting events or modifiable factors.  meds tried: Gabapentin, diclofenac, ibuprofen, naproxen, Wellbutrin, Tylenol, flexeril   Reviewed notes, labs and imaging from outside physicians, which showed:  Reviewed referring physician notes Dr. Arville Care.  Patient was seen for ear pain on the right.  No noticeable  change in her hearing.  Husband was concerned she had hearing loss her PCP diagnosed her with an ear infection and treated her with antibiotics.  Audiologist provided hearing evaluation, tympanograms were normal bilaterally, there was a high-frequency hearing loss in the left were bone-conduction was worse than air conduction.  Also experiencing severe headaches recently, sleeping helps, decongestant medication seems to help.  She complains of chronic sinus pain and pressure but has no difficulty breathing through her nose and only has occasional clear nasal discharge.  Examination appeared normal including ear canals, tympanic membranes, hearing grossly normal, nasal cavities clear with healthy mucosa no polyps or exudates.  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  No evidence of middle ear disease.  Referred to neurology for headache and referred ear pain possibly due to migraine variant.  tsh 2.21, hgba1c 5  Review of Systems: Patient complains of symptoms per HPI as well as the following symptoms: headache, insomnia. Pertinent negatives and positives per HPI. All others negative.   Social History   Socioeconomic History   Marital status: Married    Spouse name: Not on file   Number of children: 1   Years of education: Not on file   Highest education level: Bachelor's degree (e.g., BA, AB, BS)  Occupational History    Employer: [employer]  Social Needs   Financial resource strain: Not on file   Food insecurity    Worry: Not on file    Inability: Not on file   Transportation needs  Medical: Not on file    Non-medical: Not on file  Tobacco Use   Smoking status: Never Smoker   Smokeless tobacco: Never Used  Substance and Sexual Activity   Alcohol use: Never    Frequency: Never   Drug use: Never   Sexual activity: Yes    Birth control/protection: None    Comment: no cycles; pt had hysterectomy  Lifestyle    Physical activity    Days per week: Not on file    Minutes per session: Not on file   Stress: Not on file  Relationships   Social connections    Talks on phone: Not on file    Gets together: Not on file    Attends religious service: Not on file    Active member of club or organization: Not on file    Attends meetings of clubs or organizations: Not on file    Relationship status: Not on file   Intimate partner violence    Fear of current or ex partner: Not on file    Emotionally abused: Not on file    Physically abused: Not on file    Forced sexual activity: Not on file  Other Topics Concern   Not on file  Social History Narrative   Lives at home with husband Ricky   Caffeine: none    Family History  Problem Relation Age of Onset   Stroke Paternal Grandfather    Pneumonia Paternal Grandmother    Cancer Father        bone marrow cancer   Prostate cancer Father    COPD Father    Heart disease Father    Dementia Mother    Hypertension Mother    Heart disease Mother    Cancer Sister        uterine   Breast cancer Neg Hx     Past Medical History:  Diagnosis Date   Anxiety    Fibroids    h/o   H/O dysmenorrhea    H/O menorrhagia    High cholesterol    Menopausal symptoms    h/o   Migraine     Past Surgical History:  Procedure Laterality Date   ABDOMINAL HYSTERECTOMY     BACK SURGERY  2015   disc removal   LIPOMA EXCISION     MYOMECTOMY     ovarian fibroid     UTERINE FIBROID SURGERY      Current Outpatient Medications  Medication Sig Dispense Refill   Acetaminophen (TYLENOL PO) Take by mouth as needed.     buPROPion (WELLBUTRIN XL) 150 MG 24 hr tablet Take 1 tablet (150 mg total) by mouth daily. 30 tablet 0   CALCIUM PO Take by mouth.     Cyanocobalamin (B-12 PO) Take by mouth.     Ibuprofen (ADVIL PO) Take by mouth as needed.     liothyronine (CYTOMEL) 5 MCG tablet Take 5 mcg by mouth daily.     MELOXICAM PO Take by  mouth as needed (joint pain).     minocycline (MINOCIN) 100 MG capsule      Multiple Vitamins-Minerals (MULTIVITAMIN PO) Take by mouth.     Omega-3 Fatty Acids (FISH OIL PO) Take by mouth.     OVER THE COUNTER MEDICATION as needed. Sinus meds: Acetaminophen 325 mg Dextromethorphan 10 mg Guafenesin 200 mg Phenylephrine 5 mg     Fremanezumab-vfrm (AJOVY) 225 MG/1.5ML SOAJ Inject 225 mg into the skin every 30 (thirty) days. 1 pen 11   levothyroxine (  SYNTHROID) 25 MCG tablet      No current facility-administered medications for this visit.     Allergies as of 05/21/2019 - Review Complete 05/09/2019  Allergen Reaction Noted   Aspirin  05/06/2011    Vitals: BP 116/66    Pulse 76    Temp 97.8 F (36.6 C)    Wt 161 lb (73 kg)    BMI 26.38 kg/m  Last Weight:  Wt Readings from Last 1 Encounters:  05/21/19 161 lb (73 kg)   Last Height:   Ht Readings from Last 1 Encounters:  05/09/19 5' 5.5" (1.664 m)   Physical exam: Exam: Gen: NAD, conversant, well nourised, well groomed                     CV: RRR, no MRG. No Carotid Bruits. No peripheral edema, warm, nontender Eyes: Conjunctivae clear without exudates or hemorrhage  Neuro: Detailed Neurologic Exam  Speech:    Speech is normal; fluent and spontaneous with normal comprehension.  Cognition:    The patient is oriented to person, place, and time;     recent and remote memory intact;     language fluent;     normal attention, concentration,     fund of knowledge Cranial Nerves:    The pupils are equal, round, and reactive to light. The fundi are normal and spontaneous venous pulsations are present. Visual fields are full to finger confrontation. Extraocular movements are intact. Trigeminal sensation is intact and the muscles of mastication are normal. The face is symmetric. The palate elevates in the midline. Hearing loss left ear. Voice is normal. Shoulder shrug is normal. The tongue has normal motion without  fasciculations.   Coordination:    Normal finger to nose and heel to shin. Normal rapid alternating movements.   Gait:    Heel-toe and tandem gait are normal.   Motor Observation:    No asymmetry, no atrophy, and no involuntary movements noted. Tone:    Normal muscle tone.    Posture:    Posture is normal. normal erect    Strength: Right hip flexion 5-/5 otherwise strength is V/V in the upper and lower limbs.      Sensation: intact to LT     Reflex Exam:  DTR's:    Deep tendon reflexes in the upper and lower extremities are normal bilaterally.   Toes:    The toes are downgoing bilaterally.   Clonus:    Clonus is absent.       Assessment/Plan:  59 year old with chronic migraines but needs a thorough evaluation due to several concerning symptoms as below. Also with leg pain and weakness on the right.    Migraine: MRI brain normal. Start Ajovy. Maxalt and zofran acutely.  Right leg pain: radiates into the right groin, hip flexion mild weakness, +pain on hip pain on leg external and internal rotation. This may be hip pain will refer to [facility]. However need to rule out lumbar radiculopathy. She has been managing her leg pain for at least 6 months, failed conservative therapy (NSaids, heat,ice) and has been managed by PCP need MRI lumbar spine.  Orders Placed This Encounter  Procedures   MR LUMBAR SPINE WO CONTRAST   Meds ordered this encounter  Medications   Fremanezumab-vfrm (AJOVY) 225 MG/1.5ML SOAJ    Sig: Inject 225 mg into the skin every 30 (thirty) days.    Dispense:  1 pen    Refill:  11  Patient has copay card; she can have medication for $5 regardless of insurance approval or copay amount.     Discussed: To prevent or relieve headaches, try the following: Cool Compress. Lie down and place a cool compress on your head.  Avoid headache triggers. If certain foods or odors seem to have triggered your migraines in the past, avoid them. A headache diary  might help you identify triggers.  Include physical activity in your daily routine. Try a daily walk or other moderate aerobic exercise.  Manage stress. Find healthy ways to cope with the stressors, such as delegating tasks on your to-do list.  Practice relaxation techniques. Try deep breathing, yoga, massage and visualization.  Eat regularly. Eating regularly scheduled meals and maintaining a healthy diet might help prevent headaches. Also, drink plenty of fluids.  Follow a regular sleep schedule. Sleep deprivation might contribute to headaches Consider biofeedback. With this mind-body technique, you learn to control certain bodily functions -- such as muscle tension, heart rate and blood pressure -- to prevent headaches or reduce headache pain.    Proceed to emergency room if you experience new or worsening symptoms or symptoms do not resolve, if you have new neurologic symptoms or if headache is severe, or for any concerning symptom.   Provided education and documentation from American headache Society toolbox including articles on: chronic migraine medication overuse headache, chronic migraines, prevention of migraines, behavioral and other nonpharmacologic treatments for headache.   A total of 40 minutes was spent face-to-face with this patient. Over half this time was spent on counseling patient on the  1. Chronic migraine without aura without status migrainosus, not intractable   2. Right leg pain   3. Right leg weakness   4. Right leg paresthesias    diagnosis and different diagnostic and therapeutic options, counseling and coordination of care, risks ans benefits of management, compliance, or risk factor reduction and education.     Cc: Dr. Sallee Lange   Sarina Ill, MD  [facility] [address] [place], [place] [address]  Phone [phone] Fax [phone]

## 2019-05-21 NOTE — Telephone Encounter (Signed)
Med sent to pharm. Pt notified.  

## 2019-05-21 NOTE — Telephone Encounter (Signed)
Patient states on her Wellbutrin 300 was suppose to be lower to 150 from her last visit 9/16. [facility]- [address] Please advise

## 2019-05-21 NOTE — Telephone Encounter (Signed)
Copied note from 9/16 office visit note below:    We will reduce it to 150 mg if that goes well over the next month she will stop the medicine she will let us know if she has any reoccurring symptoms  Ok to send in one month of new dose to pharm?

## 2019-05-21 NOTE — Telephone Encounter (Signed)
Please send them 150 mg XL #31 daily

## 2019-05-24 ENCOUNTER — Telehealth: Payer: Self-pay | Admitting: Neurology

## 2019-05-24 NOTE — Telephone Encounter (Signed)
LVM for pt to call back about scheduling mri E UMR Auth: [id number] (exp. 05/24/19 to 06/22/19)

## 2019-06-05 LAB — FUNGUS CULTURE W SMEAR

## 2019-06-15 ENCOUNTER — Telehealth: Payer: Self-pay | Admitting: Family Medicine

## 2019-06-20 ENCOUNTER — Other Ambulatory Visit: Payer: Self-pay | Admitting: Family Medicine

## 2019-06-20 NOTE — Telephone Encounter (Signed)
Patient was in the process of tapering off of this Please explained to the patient that we received a request for refills Please check with the patient to see if she still wants this to be continued Before that she wanted to discontinue?

## 2019-06-21 NOTE — Telephone Encounter (Signed)
Left message to return call 

## 2019-06-21 NOTE — Telephone Encounter (Signed)
Patient would like to continue it at least another month and then go from there

## 2019-07-06 ENCOUNTER — Other Ambulatory Visit: Payer: Self-pay

## 2019-07-06 DIAGNOSIS — Z20822 Contact with and (suspected) exposure to covid-19: Secondary | ICD-10-CM

## 2019-07-09 LAB — NOVEL CORONAVIRUS, NAA: SARS-CoV-2, NAA: NOT DETECTED

## 2019-07-22 NOTE — Telephone Encounter (Signed)
error 

## 2019-07-24 ENCOUNTER — Other Ambulatory Visit: Payer: Self-pay | Admitting: *Deleted

## 2019-07-24 MED ORDER — BUPROPION HCL ER (XL) 150 MG PO TB24: 150.0000 mg | ORAL_TABLET | Freq: Every day | ORAL | 5 refills | Status: DC

## 2019-08-07 ENCOUNTER — Other Ambulatory Visit: Payer: Self-pay

## 2019-08-07 ENCOUNTER — Ambulatory Visit: Payer: Commercial Managed Care - PPO | Attending: Internal Medicine

## 2019-08-07 DIAGNOSIS — Z20822 Contact with and (suspected) exposure to covid-19: Secondary | ICD-10-CM

## 2019-08-08 LAB — NOVEL CORONAVIRUS, NAA: SARS-CoV-2, NAA: NOT DETECTED

## 2019-08-30 ENCOUNTER — Telehealth: Payer: Self-pay | Admitting: Neurology

## 2019-08-30 ENCOUNTER — Encounter: Payer: Self-pay | Admitting: *Deleted

## 2019-08-30 MED ORDER — ONDANSETRON HCL 4 MG PO TABS: 4.0000 mg | ORAL_TABLET | Freq: Four times a day (QID) | ORAL | 5 refills | Status: DC | PRN

## 2019-08-30 MED ORDER — RIZATRIPTAN BENZOATE 10 MG PO TABS: 10.0000 mg | ORAL_TABLET | ORAL | 5 refills | Status: DC | PRN

## 2019-08-30 NOTE — Telephone Encounter (Signed)
I called the pt and LVM (ok per DPR) asking for a call back to discuss. I let her know if she is having a severe, worst headache of her life, or any new neurological symptoms such as a weakness, numbness, vision problems, or any stroke like symptoms to call 911. If this is her regular headache/migraine we have medications we can order. Advised on call MD available after hours if needed. I let her know Dr. Jaynee Eagles had discussed Rizatriptan at last office visit and we could probably try that. Left office number in message for call back.   Spoke with Dr. Jaynee Eagles. Placed order for Ondansetron 4 mg PO q6h prn #30 refills 5 and Rizatriptan 10 mg PO q2h prn MAX 2 tablets/24 hours #10 refills 5. I will also send pt a mychart message in case we do not hear from her soon.

## 2019-08-30 NOTE — Telephone Encounter (Signed)
Pt is wanting to know if something can be called in for an acute headache. If so please send to the [facility] in [place] on [address].  Please advise.

## 2019-08-30 NOTE — Telephone Encounter (Signed)
I was able to reach the pt. I discussed the information below. She was very appreciative of the prescriptions and stated [facility] had just notified. She understands the instructions and some of the potential side effects. Pt verbalized understanding and appreciation for the call and understands detailed information went to [place] as well.

## 2019-09-03 ENCOUNTER — Telehealth: Payer: Self-pay | Admitting: *Deleted

## 2019-09-03 NOTE — Telephone Encounter (Signed)
Ajovy PA completed on CMM. Key: [id number]. Awaiting [facility] rx determination.

## 2019-09-10 ENCOUNTER — Encounter: Payer: Self-pay | Admitting: *Deleted

## 2019-09-10 NOTE — Telephone Encounter (Signed)
We received determination from [facility]. Ajovy denied. Drug not a covered benefit and excluded from coverage.   Patient has a commercial plan and should be able to use the savings card. If we should choose to appeal the decision, fax to [phone] or call [phone]. I messaged pt in [place] about the savings card.   Case # [id number]

## 2019-10-29 ENCOUNTER — Other Ambulatory Visit: Payer: Self-pay | Admitting: Family Medicine

## 2019-10-29 DIAGNOSIS — Z1231 Encounter for screening mammogram for malignant neoplasm of breast: Secondary | ICD-10-CM

## 2019-10-30 ENCOUNTER — Ambulatory Visit
Admission: RE | Admit: 2019-10-30 | Discharge: 2019-10-30 | Disposition: A | Discharge status: Home / Self Care | Payer: Commercial Managed Care - PPO | Source: Ambulatory Visit | Attending: Family Medicine | Admitting: Family Medicine

## 2019-10-30 ENCOUNTER — Other Ambulatory Visit: Payer: Self-pay

## 2019-10-30 DIAGNOSIS — Z1231 Encounter for screening mammogram for malignant neoplasm of breast: Secondary | ICD-10-CM

## 2019-11-07 ENCOUNTER — Telehealth: Payer: Self-pay | Admitting: *Deleted

## 2019-11-07 NOTE — Telephone Encounter (Signed)
Pt called with concerns of Ajovy site reaction after injecting on Sunday or Monday. Reports size is 3 inches, swollen, warm, denies itching. She denied any other symptoms besides headaches. No fever or discharge from site. Pt advised to monitor for now. She said she will put ice on it. Pt will send picture through mychart.   I informed Dr. Jaynee Eagles of the patient's symptoms. Continue to have pt monitor.

## 2019-11-19 ENCOUNTER — Telehealth: Payer: Self-pay | Admitting: *Deleted

## 2019-11-19 ENCOUNTER — Encounter: Payer: Self-pay | Admitting: Neurology

## 2019-11-19 ENCOUNTER — Ambulatory Visit: Payer: Commercial Managed Care - PPO | Admitting: Neurology

## 2019-11-19 NOTE — Progress Notes (Deleted)
[facility]    Provider:  Dr Jaynee Eagles Primary Care Physician:  Kathyrn Drown, MD  CC:  Migraines and right leg pain  Interval history 9/28.2020: Her migraines worsening 6 months,  She has congestion and anti-histamine helps. She also has right leg pain, radiates into the groin on the right and into the thigh.Either hip or low back. She has lateral leg pain, points to the area of the hip, NSAIDs help.MRI lumbar spine. Referral to [facility] for right hip pain. She has been managing her leg pain for at least 6 months, failed conservative therapy (NSaids, heat,ice) and has been managed by PCP need MRI lumbar spine.  HPI:  Madison Reyes is a 59 y.o. female here as requested by Dr. Constance Holster for migraine. PMHx depression and anxiety, lumbar stenosis s/p lumbar laminectomy. She has had headaches for years, started worsening this past May she thought it was allergies and pseudephrine helps, tylenol, 2-3x a week. Headaches 2-3x a week. It is pressure in the front of the eyes, like a balloon inside her head, lots of pressure, more on the right, throbbing, nausea, +phonophobia, movement makes it worse, laying down and sleeping helps, they can last a few hours and sleeping helps they would last 24 hours or more. No FHx of migraines. No aura. 15 headache days a month, 4 days are migrainous. She wakes with the headaches, they can be positional. Also ear pain and hearing loss left ear. She has blurry vision and hearing loss in the left ear. Mostly nausea. Headaches wake her up. She was told she may have a tumor in the left ear. No other focal neurologic deficits, associated symptoms, inciting events or modifiable factors.  meds tried: Gabapentin, diclofenac, ibuprofen, naproxen, Wellbutrin, Tylenol, flexeril   Reviewed notes, labs and imaging from outside physicians, which showed:  Reviewed referring physician notes Dr. Arville Care.  Patient was seen for ear pain on the right.  No noticeable  change in her hearing.  Husband was concerned she had hearing loss her PCP diagnosed her with an ear infection and treated her with antibiotics.  Audiologist provided hearing evaluation, tympanograms were normal bilaterally, there was a high-frequency hearing loss in the left were bone-conduction was worse than air conduction.  Also experiencing severe headaches recently, sleeping helps, decongestant medication seems to help.  She complains of chronic sinus pain and pressure but has no difficulty breathing through her nose and only has occasional clear nasal discharge.  Examination appeared normal including ear canals, tympanic membranes, hearing grossly normal, nasal cavities clear with healthy mucosa no polyps or exudates.  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  No evidence of middle ear disease.  Referred to neurology for headache and referred ear pain possibly due to migraine variant.  tsh 2.21, hgba1c 5  Review of Systems: Patient complains of symptoms per HPI as well as the following symptoms: headache, insomnia. Pertinent negatives and positives per HPI. All others negative.   Social History   Socioeconomic History  . Marital status: Married    Spouse name: Not on file  . Number of children: 1  . Years of education: Not on file  . Highest education level: Bachelor's degree (e.g., BA, AB, BS)  Occupational History    Employer: [employer]  Tobacco Use  . Smoking status: Never Smoker  . Smokeless tobacco: Never Used  Substance and Sexual Activity  . Alcohol use: Never  . Drug use: Never  . Sexual activity:  Yes    Birth control/protection: None    Comment: no cycles; pt had hysterectomy  Other Topics Concern  . Not on file  Social History Narrative   Lives at home with husband Madison Reyes   Caffeine: none   Social Determinants of Health   Financial Resource Strain:   . Difficulty of Paying Living Expenses:   Food Insecurity:    . Worried About Charity fundraiser in the Last Year:   . Arboriculturist in the Last Year:   Transportation Needs:   . Film/video editor (Medical):   Marland Kitchen Lack of Transportation (Non-Medical):   Physical Activity:   . Days of Exercise per Week:   . Minutes of Exercise per Session:   Stress:   . Feeling of Stress :   Social Connections:   . Frequency of Communication with Friends and Family:   . Frequency of Social Gatherings with Friends and Family:   . Attends Religious Services:   . Active Member of Clubs or Organizations:   . Attends Archivist Meetings:   Marland Kitchen Marital Status:   Intimate Partner Violence:   . Fear of Current or Ex-Partner:   . Emotionally Abused:   Marland Kitchen Physically Abused:   . Sexually Abused:     Family History  Problem Relation Age of Onset  . Stroke Paternal Grandfather   . Pneumonia Paternal Grandmother   . Cancer Father        bone marrow cancer  . Prostate cancer Father   . COPD Father   . Heart disease Father   . Dementia Mother   . Hypertension Mother   . Heart disease Mother   . Cancer Sister        uterine  . Breast cancer Neg Hx     Past Medical History:  Diagnosis Date  . Anxiety   . Fibroids    h/o  . H/O dysmenorrhea   . H/O menorrhagia   . High cholesterol   . Menopausal symptoms    h/o  . Migraine     Past Surgical History:  Procedure Laterality Date  . ABDOMINAL HYSTERECTOMY    . BACK SURGERY  2015   disc removal  . LIPOMA EXCISION    . MYOMECTOMY    . ovarian fibroid    . UTERINE FIBROID SURGERY      Current Outpatient Medications  Medication Sig Dispense Refill  . Acetaminophen (TYLENOL PO) Take by mouth as needed.    Marland Kitchen buPROPion (WELLBUTRIN XL) 150 MG 24 hr tablet Take 1 tablet (150 mg total) by mouth daily. 30 tablet 5  . CALCIUM PO Take by mouth.    . Cyanocobalamin (B-12 PO) Take by mouth.    . Fremanezumab-vfrm (AJOVY) 225 MG/1.5ML SOAJ Inject 225 mg into the skin every 30 (thirty) days. 1 pen  11  . Ibuprofen (ADVIL PO) Take by mouth as needed.    Marland Kitchen levothyroxine (SYNTHROID) 25 MCG tablet     . liothyronine (CYTOMEL) 5 MCG tablet Take 5 mcg by mouth daily.    . MELOXICAM PO Take by mouth as needed (joint pain).    . minocycline (MINOCIN) 100 MG capsule     . Multiple Vitamins-Minerals (MULTIVITAMIN PO) Take by mouth.    . Omega-3 Fatty Acids (FISH OIL PO) Take by mouth.    . ondansetron (ZOFRAN) 4 MG tablet Take 1 tablet (4 mg total) by mouth every 6 (six) hours as needed for nausea or vomiting. [place]  tablet 5  . OVER THE COUNTER MEDICATION as needed. Sinus meds: Acetaminophen 325 mg Dextromethorphan 10 mg Guafenesin 200 mg Phenylephrine 5 mg    . rizatriptan (MAXALT) 10 MG tablet Take 1 tablet (10 mg total) by mouth as needed for migraine. May repeat in 2 hours if needed. Do not exceed 2 tablets in 24 hours. 10 tablet 5   No current facility-administered medications for this visit.    Allergies as of 11/19/2019 - Review Complete 05/09/2019  Allergen Reaction Noted  . Aspirin  05/06/2011    Vitals: There were no vitals taken for this visit. Last Weight:  Wt Readings from Last 1 Encounters:  05/21/19 161 lb (73 kg)   Last Height:   Ht Readings from Last 1 Encounters:  05/09/19 5' 5.5" (1.664 m)   Physical exam: Exam: Gen: NAD, conversant, well nourised, well groomed                     CV: RRR, no MRG. No Carotid Bruits. No peripheral edema, warm, nontender Eyes: Conjunctivae clear without exudates or hemorrhage  Neuro: Detailed Neurologic Exam  Speech:    Speech is normal; fluent and spontaneous with normal comprehension.  Cognition:    The patient is oriented to person, place, and time;     recent and remote memory intact;     language fluent;     normal attention, concentration,     fund of knowledge Cranial Nerves:    The pupils are equal, round, and reactive to light. The fundi are normal and spontaneous venous pulsations are present. Visual fields are  full to finger confrontation. Extraocular movements are intact. Trigeminal sensation is intact and the muscles of mastication are normal. The face is symmetric. The palate elevates in the midline. Hearing loss left ear. Voice is normal. Shoulder shrug is normal. The tongue has normal motion without fasciculations.   Coordination:    Normal finger to nose and heel to shin. Normal rapid alternating movements.   Gait:    Heel-toe and tandem gait are normal.   Motor Observation:    No asymmetry, no atrophy, and no involuntary movements noted. Tone:    Normal muscle tone.    Posture:    Posture is normal. normal erect    Strength: Right hip flexion 5-/5 otherwise strength is V/V in the upper and lower limbs.      Sensation: intact to LT     Reflex Exam:  DTR's:    Deep tendon reflexes in the upper and lower extremities are normal bilaterally.   Toes:    The toes are downgoing bilaterally.   Clonus:    Clonus is absent.       Assessment/Plan:  59 year old with chronic migraines but needs a thorough evaluation due to several concerning symptoms as below. Also with leg pain and weakness on the right.    Migraine: MRI brain normal. Start Ajovy. Maxalt and zofran acutely.  Right leg pain: radiates into the right groin, hip flexion mild weakness, +pain on hip pain on leg external and internal rotation. This may be hip pain will refer to [facility]. However need to rule out lumbar radiculopathy. She has been managing her leg pain for at least 6 months, failed conservative therapy (NSaids, heat,ice) and has been managed by PCP need MRI lumbar spine.  No orders of the defined types were placed in this encounter.  No orders of the defined types were placed in this encounter.  Discussed: To prevent or relieve headaches, try the following: Cool Compress. Lie down and place a cool compress on your head.  Avoid headache triggers. If certain foods or odors seem to have triggered your  migraines in the past, avoid them. A headache diary might help you identify triggers.  Include physical activity in your daily routine. Try a daily walk or other moderate aerobic exercise.  Manage stress. Find healthy ways to cope with the stressors, such as delegating tasks on your to-do list.  Practice relaxation techniques. Try deep breathing, yoga, massage and visualization.  Eat regularly. Eating regularly scheduled meals and maintaining a healthy diet might help prevent headaches. Also, drink plenty of fluids.  Follow a regular sleep schedule. Sleep deprivation might contribute to headaches Consider biofeedback. With this mind-body technique, you learn to control certain bodily functions -- such as muscle tension, heart rate and blood pressure -- to prevent headaches or reduce headache pain.    Proceed to emergency room if you experience new or worsening symptoms or symptoms do not resolve, if you have new neurologic symptoms or if headache is severe, or for any concerning symptom.   Provided education and documentation from American headache Society toolbox including articles on: chronic migraine medication overuse headache, chronic migraines, prevention of migraines, behavioral and other nonpharmacologic treatments for headache.   A total of 40 minutes was spent face-to-face with this patient. Over half this time was spent on counseling patient on the  No diagnosis found. diagnosis and different diagnostic and therapeutic options, counseling and coordination of care, risks ans benefits of management, compliance, or risk factor reduction and education.     Cc: Dr. Sallee Lange   Sarina Ill, MD  [facility] [address] [place], [place] [address]  Phone [phone] Fax [phone]

## 2019-11-19 NOTE — Telephone Encounter (Signed)
Pt did not show to her follow-up appt today with Dr. Jaynee Eagles.

## 2019-11-26 ENCOUNTER — Ambulatory Visit: Payer: Commercial Managed Care - PPO | Attending: Internal Medicine

## 2019-11-26 ENCOUNTER — Other Ambulatory Visit: Payer: Self-pay

## 2019-11-26 DIAGNOSIS — Z20822 Contact with and (suspected) exposure to covid-19: Secondary | ICD-10-CM

## 2019-11-27 LAB — NOVEL CORONAVIRUS, NAA: SARS-CoV-2, NAA: NOT DETECTED

## 2019-11-27 LAB — SARS-COV-2, NAA 2 DAY TAT

## 2019-12-07 ENCOUNTER — Telehealth: Payer: Self-pay | Admitting: Family Medicine

## 2019-12-07 DIAGNOSIS — Z1329 Encounter for screening for other suspected endocrine disorder: Secondary | ICD-10-CM

## 2019-12-07 NOTE — Telephone Encounter (Signed)
Patient left a message on the billing line stating she would like a referral to see an endocrinologists because she is having problems with her thyroid.

## 2019-12-10 NOTE — Telephone Encounter (Signed)
May have referral to endocrinology. (If patient is wanting [place] to do thyroid testing etc. I would also want to do an office visit with her but if she is wanting to just get referred and let endocrinology run with it then that is all right)

## 2019-12-10 NOTE — Telephone Encounter (Signed)
Endocrinology referral placed and pt is aware.

## 2020-01-09 ENCOUNTER — Other Ambulatory Visit: Payer: Self-pay

## 2020-01-09 ENCOUNTER — Other Ambulatory Visit: Payer: Self-pay | Admitting: "Endocrinology

## 2020-01-09 ENCOUNTER — Other Ambulatory Visit (HOSPITAL_COMMUNITY)
Admission: RE | Admit: 2020-01-09 | Discharge: 2020-01-09 | Disposition: A | Discharge status: Home / Self Care | Payer: Commercial Managed Care - PPO | Source: Ambulatory Visit | Attending: "Endocrinology | Admitting: "Endocrinology

## 2020-01-09 ENCOUNTER — Encounter: Payer: Self-pay | Admitting: "Endocrinology

## 2020-01-09 ENCOUNTER — Ambulatory Visit (INDEPENDENT_AMBULATORY_CARE_PROVIDER_SITE_OTHER): Payer: Commercial Managed Care - PPO | Admitting: "Endocrinology

## 2020-01-09 VITALS — BP 126/77 | HR 61 | Ht 65.5 in | Wt 160.8 lb

## 2020-01-09 DIAGNOSIS — E039 Hypothyroidism, unspecified: Secondary | ICD-10-CM

## 2020-01-09 LAB — TSH: TSH: 0.718 u[IU]/mL (ref 0.350–4.500)

## 2020-01-09 LAB — T4, FREE: Free T4: 0.72 ng/dL (ref 0.61–1.12)

## 2020-01-09 NOTE — Progress Notes (Signed)
Endocrinology Consult Note                                            01/09/2020, 5:07 PM   Subjective:    Patient ID: Madison Reyes, female    DOB: [birth year], PCP Kathyrn Drown, MD   Past Medical History:  Diagnosis Date  . Anxiety   . Fibroids    h/o  . H/O dysmenorrhea   . H/O menorrhagia   . High cholesterol   . Menopausal symptoms    h/o  . Migraine    Past Surgical History:  Procedure Laterality Date  . ABDOMINAL HYSTERECTOMY    . BACK SURGERY  2015   disc removal  . LIPOMA EXCISION    . MYOMECTOMY    . ovarian fibroid    . UTERINE FIBROID SURGERY     Social History   Socioeconomic History  . Marital status: Married    Spouse name: Not on file  . Number of children: 1  . Years of education: Not on file  . Highest education level: Bachelor's degree (e.g., BA, AB, BS)  Occupational History    Employer: [employer]  Tobacco Use  . Smoking status: Never Smoker  . Smokeless tobacco: Never Used  Substance and Sexual Activity  . Alcohol use: Never  . Drug use: Never  . Sexual activity: Yes    Birth control/protection: None    Comment: no cycles; pt had hysterectomy  Other Topics Concern  . Not on file  Social History Narrative   Lives at home with husband Ricky   Caffeine: none   Social Determinants of Health   Financial Resource Strain:   . Difficulty of Paying Living Expenses:   Food Insecurity:   . Worried About Charity fundraiser in the Last Year:   . Arboriculturist in the Last Year:   Transportation Needs:   . Film/video editor (Medical):   Marland Kitchen Lack of Transportation (Non-Medical):   Physical Activity:   . Days of Exercise per Week:   . Minutes of Exercise per Session:   Stress:   . Feeling of Stress :   Social Connections:   . Frequency of Communication with Friends and Family:   . Frequency of Social Gatherings with Friends and Family:   . Attends Religious Services:   . Active Member of Clubs or  Organizations:   . Attends Archivist Meetings:   Marland Kitchen Marital Status:    Family History  Problem Relation Age of Onset  . Stroke Paternal Grandfather   . Pneumonia Paternal Grandmother   . Cancer Father        bone marrow cancer  . Prostate cancer Father   . COPD Father   . Heart disease Father   . Dementia Mother   . Hypertension Mother   . Heart disease Mother   . Cancer Sister        uterine  . Breast cancer Neg Hx    Outpatient Encounter Medications as of 01/09/2020  Medication Sig  . Cholecalciferol (VITAMIN D3) 50 MCG (2000 UT) TABS Take by mouth.  . vitamin k 100 MCG tablet Take 100 mcg by mouth daily.  Francia Greaves THYROID 30 MG tablet Take 30 mg by mouth every morning.  Marland Kitchen buPROPion (WELLBUTRIN XL) 150 MG 24 hr  tablet Take 1 tablet (150 mg total) by mouth daily.  Marland Kitchen CALCIUM PO Take by mouth.  . Ibuprofen (ADVIL PO) Take by mouth as needed.  . minocycline (MINOCIN) 100 MG capsule   . Multiple Vitamins-Minerals (MULTIVITAMIN PO) Take by mouth.  Marland Kitchen OVER THE COUNTER MEDICATION as needed. Sinus meds: Acetaminophen 325 mg Dextromethorphan 10 mg Guafenesin 200 mg Phenylephrine 5 mg  . [DISCONTINUED] Acetaminophen (TYLENOL PO) Take by mouth as needed.  . [DISCONTINUED] Cyanocobalamin (B-12 PO) Take by mouth.  . [DISCONTINUED] Fremanezumab-vfrm (AJOVY) 225 MG/1.5ML SOAJ Inject 225 mg into the skin every 30 (thirty) days.  . [DISCONTINUED] levothyroxine (SYNTHROID) 25 MCG tablet   . [DISCONTINUED] liothyronine (CYTOMEL) 5 MCG tablet Take 5 mcg by mouth daily.  . [DISCONTINUED] MELOXICAM PO Take by mouth as needed (joint pain).  . [DISCONTINUED] Omega-3 Fatty Acids (FISH OIL PO) Take by mouth.  . [DISCONTINUED] ondansetron (ZOFRAN) 4 MG tablet Take 1 tablet (4 mg total) by mouth every 6 (six) hours as needed for nausea or vomiting.  . [DISCONTINUED] rizatriptan (MAXALT) 10 MG tablet Take 1 tablet (10 mg total) by mouth as needed for migraine. May repeat in 2 hours if needed.  Do not exceed 2 tablets in 24 hours.   No facility-administered encounter medications on file as of 01/09/2020.   ALLERGIES: Allergies  Allergen Reactions  . Aspirin     "stomach sensitivity"    VACCINATION STATUS:  There is no immunization history on file for this patient.  HPI Madison Reyes is 59 y.o. female who presents today with a medical history as above. she is being seen in consultation for hypothyroidism requested by Kathyrn Drown, MD.  History is obtained directly from the patient.  She gives history of being diagnosed with hypothyroidism in 2019.  She was given various dose of levothyroxine, levothyroxine.  During her last visit with her integrative health care providers she was switched to Armour Thyroid 30mg  daily.  She did not have recent thyroid function test.  -She did not have acute complaints today.  She reports fluctuating body weight.  She has family history of hypothyroidism in one of her siblings, goiter which required surgery in her mother.  She denies any family history of thyroid malignancy.   Review of Systems  Constitutional: no recent weight gain/loss, + fatigue, no subjective hyperthermia, no subjective hypothermia Eyes: no blurry vision, no xerophthalmia ENT: no sore throat, no nodules palpated in throat, no dysphagia/odynophagia, no hoarseness Cardiovascular: no Chest Pain, no Shortness of Breath, no palpitations, no leg swelling Respiratory: no cough, no shortness of breath Gastrointestinal: no Nausea/Vomiting/Diarhhea Musculoskeletal: no muscle/joint aches Skin: no rashes Neurological: no tremors, no numbness, no tingling, no dizziness Psychiatric: no depression, no anxiety  Objective:    Vitals with BMI 01/09/2020 05/21/2019 05/09/2019  Height 5' 5.5" - 5' 5.5"  Weight 160 lbs 13 oz 161 lbs 155 lbs  BMI 26.34 123XX123 [id number]  Systolic 123XX123 [id number]  Diastolic 77 66 70  Pulse 61 76 -    BP 126/77   Pulse 61   Ht 5' 5.5" (1.664 m)   Wt 160 lb  12.8 oz (72.9 kg)   BMI 26.35 kg/m   Wt Readings from Last 3 Encounters:  01/09/20 160 lb 12.8 oz (72.9 kg)  05/21/19 161 lb (73 kg)  05/09/19 155 lb (70.3 kg)    Physical Exam  Constitutional:  Body mass index is 26.35 kg/m.,  not in acute distress, normal state of mind Eyes: PERRLA,  EOMI, no exophthalmos ENT: moist mucous membranes, + gross thyromegaly, no gross cervical lymphadenopathy Cardiovascular: normal precordial activity, Regular Rate and Rhythm, no Murmur/Rubs/Gallops Respiratory:  adequate breathing efforts, no gross chest deformity, Clear to auscultation bilaterally Gastrointestinal: abdomen soft, Non -tender, No distension, Bowel Sounds present, no gross organomegaly Musculoskeletal: no gross deformities, strength intact in all four extremities Skin: moist, warm, no rashes Neurological: no tremor with outstretched hands, Deep tendon reflexes normal in bilateral lower extremities.  CMP ( most recent) CMP     Component Value Date/Time   NA 140 05/14/2019 1022   K 3.9 05/14/2019 1022   CL 102 05/14/2019 1022   CO2 23 05/14/2019 1022   GLUCOSE 79 05/14/2019 1022   GLUCOSE 88 04/03/2013 0740   BUN 12 05/14/2019 1022   CREATININE 0.91 05/14/2019 1022   CREATININE 0.85 04/03/2013 0740   CALCIUM 9.8 05/14/2019 1022   PROT 6.8 11/05/2017 1018   ALBUMIN 4.4 11/05/2017 1018   AST 18 11/05/2017 1018   ALT 15 11/05/2017 1018   ALKPHOS 47 11/05/2017 1018   BILITOT 0.8 11/05/2017 1018   GFRNONAA 70 05/14/2019 1022   GFRAA 81 05/14/2019 1022     Diabetic Labs (most recent): Lab Results  Component Value Date   HGBA1C 5.1% 11/19/2017   HGBA1C 5.0 11/05/2017     Lipid Panel ( most recent) Lipid Panel     Component Value Date/Time   CHOL 290 (H) 05/14/2019 1022   TRIG 105 05/14/2019 1022   HDL 65 05/14/2019 1022   CHOLHDL 4.5 (H) 05/14/2019 1022   CHOLHDL 4.3 04/03/2013 0740   VLDL 24 04/03/2013 0740   LDLCALC 207 (H) 05/14/2019 1022   LABVLDL 18 05/14/2019  1022      Assessment & Plan:   1. Hypothyroidism, unspecified type  - Madison Reyes  is being seen at a kind request of Luking, Elayne Snare, MD. - I have reviewed her available thyroid records and clinically evaluated the patient.  -She has a diagnosis of hypothyroidism for which she is taking minimal dose of thyroid hormone in the form of Armour thyroid 30 mg p.o. daily. -The circumstance of her initial diagnosis are not available to review. -She does not have recent full set of thyroid function tests.  She will be sent to lab today. -If she is confirmed to have hypothyroidism, she will be approached for levothyroxine or Synthroid for the ease of dose adjustment in follow-up. In the meantime, she is advised to continue on her current dose of Armour Thyroid 30 mg p.o. daily before breakfast. -Given her clinical goiter, she is offered baseline thyroid/neck ultrasound.  -Her labs will include TSH, free T4/free T3, thyroid antibodies. - I did not initiate any new prescriptions today. - she is advised to maintain close follow up with Kathyrn Drown, MD for primary care needs.   - Time spent with the patient: 60 minutes, of which >50% was spent in  counseling her about her hypothyroidism and the rest in obtaining information about her symptoms, reviewing her previous labs/studies ( including abstractions from other facilities),  evaluations, and treatments,  and developing a plan to confirm diagnosis and long term treatment based on the latest standards of care/guidelines; and documenting her care.  Madison Reyes participated in the discussions, expressed understanding, and voiced agreement with the above plans.  All questions were answered to her satisfaction. she is encouraged to contact clinic should she have any questions or concerns prior to her return visit.  Follow up plan: Return in about 10 days (around 01/19/2020) for Labs Today- Non-Fasting Ok, Thyroid / Neck Ultrasound, NV A1c in  Office.   Glade Lloyd, MD [facility] [address] [place], [place] [address] Phone: [phone]  Fax: [phone]     01/09/2020, 5:07 PM  This note was partially dictated with voice recognition software. Similar sounding words can be transcribed inadequately or may not  be corrected upon review.

## 2020-01-10 LAB — THYROGLOBULIN ANTIBODY: Thyroglobulin Antibody: <1 IU/mL (ref 0.0–0.9)

## 2020-01-10 LAB — THYROID PEROXIDASE ANTIBODY: Thyroperoxidase Ab SerPl-aCnc: <9 IU/mL (ref 0–34)

## 2020-01-10 LAB — T3, FREE: T3, Free: 2.9 pg/mL (ref 2.0–4.4)

## 2020-01-11 ENCOUNTER — Other Ambulatory Visit: Payer: Self-pay

## 2020-01-11 ENCOUNTER — Ambulatory Visit (HOSPITAL_COMMUNITY)
Admission: RE | Admit: 2020-01-11 | Discharge: 2020-01-11 | Disposition: A | Discharge status: Home / Self Care | Payer: Commercial Managed Care - PPO | Source: Ambulatory Visit | Attending: "Endocrinology | Admitting: "Endocrinology

## 2020-01-11 ENCOUNTER — Telehealth: Payer: Self-pay | Admitting: Family Medicine

## 2020-01-11 DIAGNOSIS — E039 Hypothyroidism, unspecified: Secondary | ICD-10-CM | POA: Insufficient documentation

## 2020-01-11 DIAGNOSIS — E7800 Pure hypercholesterolemia, unspecified: Secondary | ICD-10-CM

## 2020-01-11 NOTE — Telephone Encounter (Signed)
Last lipid panel completed 05/14/2019. Please advise. Thank you

## 2020-01-11 NOTE — Telephone Encounter (Signed)
Pt would like to have her cholesterol checked.

## 2020-01-13 NOTE — Telephone Encounter (Signed)
Please go ahead with lipid profile

## 2020-01-14 NOTE — Telephone Encounter (Signed)
Left message to return call to let pt know orders have been put in and she can go to [facility] when she is ready to do bw.

## 2020-01-17 LAB — LIPID PANEL
Chol/HDL Ratio: 3.7 ratio (ref 0.0–4.4)
Cholesterol, Total: 232 mg/dL — ABNORMAL HIGH (ref 100–199)
HDL: 62 mg/dL (ref >39)
LDL Chol Calc (NIH): 153 mg/dL — ABNORMAL HIGH (ref 0–99)
Triglycerides: 97 mg/dL (ref 0–149)
VLDL Cholesterol Cal: 17 mg/dL (ref 5–40)

## 2020-01-22 ENCOUNTER — Other Ambulatory Visit: Payer: Self-pay | Admitting: Family Medicine

## 2020-01-23 ENCOUNTER — Other Ambulatory Visit: Payer: Self-pay

## 2020-01-23 ENCOUNTER — Encounter: Payer: Self-pay | Admitting: "Endocrinology

## 2020-01-23 ENCOUNTER — Ambulatory Visit (INDEPENDENT_AMBULATORY_CARE_PROVIDER_SITE_OTHER): Payer: Commercial Managed Care - PPO | Admitting: "Endocrinology

## 2020-01-23 VITALS — BP 119/71 | HR 70 | Ht 65.5 in | Wt 161.8 lb

## 2020-01-23 DIAGNOSIS — E042 Nontoxic multinodular goiter: Secondary | ICD-10-CM

## 2020-01-23 MED ORDER — BUPROPION HCL ER (XL) 150 MG PO TB24: 150.0000 mg | ORAL_TABLET | Freq: Every day | ORAL | 2 refills | Status: DC

## 2020-01-23 NOTE — Patient Instructions (Signed)

## 2020-01-23 NOTE — Progress Notes (Signed)
01/23/2020, 4:34 PM  Endocrinology follow-up note   Subjective:    Patient ID: Madison Reyes, female    DOB: Jul 17, 1961, PCP Madison Drown, MD   Past Medical History:  Diagnosis Date  . Anxiety   . Fibroids    h/o  . H/O dysmenorrhea   . H/O menorrhagia   . High cholesterol   . Menopausal symptoms    h/o  . Migraine    Past Surgical History:  Procedure Laterality Date  . ABDOMINAL HYSTERECTOMY    . BACK SURGERY  2015   disc removal  . LIPOMA EXCISION    . MYOMECTOMY    . ovarian fibroid    . UTERINE FIBROID SURGERY     Social History   Socioeconomic History  . Marital status: Married    Spouse name: Not on file  . Number of children: 1  . Years of education: Not on file  . Highest education level: Bachelor's degree (e.g., BA, AB, BS)  Occupational History    Employer: [employer]  Tobacco Use  . Smoking status: Never Smoker  . Smokeless tobacco: Never Used  Substance and Sexual Activity  . Alcohol use: Never  . Drug use: Never  . Sexual activity: Yes    Birth control/protection: None    Comment: no cycles; pt had hysterectomy  Other Topics Concern  . Not on file  Social History Narrative   Lives at home with husband Madison Reyes   Caffeine: none   Social Determinants of Health   Financial Resource Strain:   . Difficulty of Paying Living Expenses:   Food Insecurity:   . Worried About Charity fundraiser in the Last Year:   . Arboriculturist in the Last Year:   Transportation Needs:   . Film/video editor (Medical):   Marland Kitchen Lack of Transportation (Non-Medical):   Physical Activity:   . Days of Exercise per Week:   . Minutes of Exercise per Session:   Stress:   . Feeling of Stress :   Social Connections:   . Frequency of Communication with Friends and Family:   . Frequency of Social Gatherings with Friends and Family:   . Attends Religious Services:   . Active Member of Clubs or  Organizations:   . Attends Archivist Meetings:   Marland Kitchen Marital Status:    Family History  Problem Relation Age of Onset  . Stroke Paternal Grandfather   . Pneumonia Paternal Grandmother   . Cancer Father        bone marrow cancer  . Prostate cancer Father   . COPD Father   . Heart disease Father   . Dementia Mother   . Hypertension Mother   . Heart disease Mother   . Cancer Sister        uterine  . Breast cancer Neg Hx    Outpatient Encounter Medications as of 01/23/2020  Medication Sig  . buPROPion (WELLBUTRIN XL) 150 MG 24 hr tablet Take 1 tablet (150 mg total) by mouth daily.  Marland Kitchen CALCIUM PO Take by mouth.  . Cholecalciferol (VITAMIN D3) 50 MCG (2000 UT) TABS Take by mouth.  . Ibuprofen (ADVIL PO) Take by mouth as  needed.  . minocycline (MINOCIN) 100 MG capsule   . Multiple Vitamins-Minerals (MULTIVITAMIN PO) Take by mouth.  Marland Kitchen OVER THE COUNTER MEDICATION as needed. Sinus meds: Acetaminophen 325 mg Dextromethorphan 10 mg Guafenesin 200 mg Phenylephrine 5 mg  . vitamin k 100 MCG tablet Take 100 mcg by mouth daily.  . [DISCONTINUED] ARMOUR THYROID 30 MG tablet Take 30 mg by mouth every morning.  . [DISCONTINUED] buPROPion (WELLBUTRIN XL) 150 MG 24 hr tablet Take 1 tablet (150 mg total) by mouth daily.   No facility-administered encounter medications on file as of 01/23/2020.   ALLERGIES: Allergies  Allergen Reactions  . Aspirin     "stomach sensitivity"    VACCINATION STATUS:  There is no immunization history on file for this patient.  HPI Madison Reyes is 59 y.o. female who presents today with a medical history as above. she is being seen in follow-up after she was seen in consultation for hypothyroidism requested by Madison Drown, MD.  History is obtained directly from the patient.  She gives history of being diagnosed with hypothyroidism in 2019.  She was given various dose of levothyroxine, levothyroxine with mixed results and recently switched to Armour  Thyroid 30 mg p.o. daily.  -She denies palpitations, tremors, heat/cold intolerance. -She did not have acute complaints today.  She reports fluctuating body weight.  She has family history of hypothyroidism in one of her siblings, goiter which required surgery in her mother.  She denies any family history of thyroid malignancy.   Review of Systems  Constitutional: + Steady weight, + fatigue, no subjective hyperthermia, no subjective hypothermia Eyes: no blurry vision, no xerophthalmia ENT: no sore throat, no nodules palpated in throat, no dysphagia/odynophagia, no hoarseness Cardiovascular: no Chest Pain, no Shortness of Breath, no palpitations, no leg swelling Respiratory: + Occasional, positional cough.   no shortness of breath Gastrointestinal: no Nausea/Vomiting/Diarhhea Musculoskeletal: no muscle/joint aches Skin: no rashes Neurological: no tremors, no numbness, no tingling, no dizziness Psychiatric: no depression, no anxiety  Objective:    Vitals with BMI 01/23/2020 01/09/2020 05/21/2019  Height 5' 5.5" 5' 5.5" -  Weight 161 lbs 13 oz 160 lbs 13 oz 161 lbs  BMI 26.51 123XX123 123XX123  Systolic 123456 123XX123 [id number]  Diastolic 71 77 66  Pulse 70 61 76    BP 119/71   Pulse 70   Ht 5' 5.5" (1.664 m)   Wt 161 lb 12.8 oz (73.4 kg)   BMI 26.52 kg/m   Wt Readings from Last 3 Encounters:  01/23/20 161 lb 12.8 oz (73.4 kg)  01/09/20 160 lb 12.8 oz (72.9 kg)  05/21/19 161 lb (73 kg)    Physical Exam  Constitutional:  Body mass index is 26.52 kg/m.,  not in acute distress, normal state of mind Eyes: PERRLA, EOMI, no exophthalmos ENT: moist mucous membranes, + gross thyromegaly, no gross cervical lymphadenopathy Cardiovascular: normal precordial activity, Regular Rate and Rhythm, no Murmur/Rubs/Gallops Respiratory:  adequate breathing efforts, no gross chest deformity, Clear to auscultation bilaterally Gastrointestinal: abdomen soft, Non -tender, No distension, Bowel Sounds present, no gross  organomegaly Musculoskeletal: no gross deformities, strength intact in all four extremities Skin: moist, warm, no rashes Neurological: no tremor with outstretched hands, Deep tendon reflexes normal in bilateral lower extremities.  CMP ( most recent) CMP     Component Value Date/Time   NA 140 05/14/2019 1022   K 3.9 05/14/2019 1022   CL 102 05/14/2019 1022   CO2 23 05/14/2019 1022   GLUCOSE 79  05/14/2019 1022   GLUCOSE 88 04/03/2013 0740   BUN 12 05/14/2019 1022   CREATININE 0.91 05/14/2019 1022   CREATININE 0.85 04/03/2013 0740   CALCIUM 9.8 05/14/2019 1022   PROT 6.8 11/05/2017 1018   ALBUMIN 4.4 11/05/2017 1018   AST 18 11/05/2017 1018   ALT 15 11/05/2017 1018   ALKPHOS 47 11/05/2017 1018   BILITOT 0.8 11/05/2017 1018   GFRNONAA 70 05/14/2019 1022   GFRAA 81 05/14/2019 1022     Diabetic Labs (most recent): Lab Results  Component Value Date   HGBA1C 5.1% 11/19/2017   HGBA1C 5.0 11/05/2017     Lipid Panel ( most recent) Lipid Panel     Component Value Date/Time   CHOL 232 (H) 01/16/2020 0913   TRIG 97 01/16/2020 0913   HDL 62 01/16/2020 0913   CHOLHDL 3.7 01/16/2020 0913   CHOLHDL 4.3 04/03/2013 0740   VLDL 24 04/03/2013 0740   LDLCALC 153 (H) 01/16/2020 0913   LABVLDL 17 01/16/2020 0913     Recent Results (from the past 2160 hour(s))  Novel Coronavirus, NAA ([facility])     Status: None   Collection Time: 11/26/19  2:22 PM   Specimen: Nasopharyngeal(NP) swabs in vial transport medium   NASOPHARYNGE  TESTING  Result Value Ref Range   SARS-CoV-2, NAA Not Detected Not Detected    Comment: This nucleic acid amplification test was developed and its performance characteristics determined by [employer]. Nucleic acid amplification tests include RT-PCR and TMA. This test has not been FDA cleared or approved. This test has been authorized by FDA under an Emergency Use Authorization (EUA). This test is only authorized for the duration of time the  declaration that circumstances exist justifying the authorization of the emergency use of in vitro diagnostic tests for detection of SARS-CoV-2 virus and/or diagnosis of COVID-19 infection under section 564(b)(1) of the Act, 21 U.S.C. [id number]) (1), unless the authorization is terminated or revoked sooner. When diagnostic testing is negative, the possibility of a false negative result should be considered in the context of a patient's recent exposures and the presence of clinical signs and symptoms consistent with COVID-19. An individual without symptoms of COVID-19 and who is not shedding SARS-CoV-2 virus wo uld expect to have a negative (not detected) result in this assay.   SARS-COV-2, NAA 2 DAY TAT     Status: None   Collection Time: 11/26/19  2:22 PM   NASOPHARYNGE  TESTING  Result Value Ref Range   SARS-CoV-2, NAA 2 DAY TAT Performed   TSH     Status: None   Collection Time: 01/09/20  2:38 PM  Result Value Ref Range   TSH 0.718 0.350 - 4.500 uIU/mL    Comment: Performed by a 3rd Generation assay with a functional sensitivity of <=0.01 uIU/mL. Performed at [facility], [address] [place], [place] [address]   T4, free     Status: None   Collection Time: 01/09/20  2:38 PM  Result Value Ref Range   Free T4 0.72 0.61 - 1.12 ng/dL    Comment: (NOTE) Biotin ingestion may interfere with free T4 tests. If the results are inconsistent with the TSH level, previous test results, or the clinical presentation, then consider biotin interference. If needed, order repeat testing after stopping biotin. Performed at [facility], [place] [address] [place],  [address]   T3, free     Status: None   Collection Time: 01/09/20  2:38 PM  Result Value Ref Range  T3, Free 2.9 2.0 - 4.4 pg/mL    Comment: (NOTE) Performed At: [facility] [place], [place] [id number] Rush Farmer MD [id number]   Thyroid peroxidase antibody     Status: None    Collection Time: 01/09/20  2:38 PM  Result Value Ref Range   Thyroperoxidase Ab SerPl-aCnc <9 0 - 34 IU/mL    Comment: (NOTE) Performed At: [facility] [place], [place] [id number] Rush Farmer MD [id number]   Thyroglobulin antibody     Status: None   Collection Time: 01/09/20  2:38 PM  Result Value Ref Range   Thyroglobulin Antibody <1.0 0.0 - 0.9 IU/mL    Comment: (NOTE) Thyroglobulin Antibody measured by [facility] Methodology Performed At: [facility] [id number] [place], [place] [id number] Rush Farmer MD [id number]   Lipid panel     Status: Abnormal   Collection Time: 01/16/20  9:13 AM  Result Value Ref Range   Cholesterol, Total 232 (H) 100 - 199 mg/dL   Triglycerides 97 0 - 149 mg/dL   HDL 62 >39 mg/dL   VLDL Cholesterol Cal 17 5 - 40 mg/dL   LDL Chol Calc (NIH) 153 (H) 0 - 99 mg/dL   Chol/HDL Ratio 3.7 0.0 - 4.4 ratio    Comment:                                   T. Chol/HDL Ratio                                             Men  Women                               1/2 Avg.Risk  3.4    3.3                                   Avg.Risk  5.0    4.4                                2X Avg.Risk  9.6    7.1                                3X Avg.Risk 23.4   11.0     Jan 11, 2020 thyroid ultrasound: Right lobe measured 5.6 cm x 1.6 cm x 1.4 cm with no nodules Left lobe measures 6.4 cm x 1.9 cm x 1.9 cm with 1 cm nonsuspicious nodule in the mid section.  Assessment & Plan:   1.  Nodular goiter  -Her previsit labs and ultrasound findings were discussed with her and her husband in the exam room.  She is currently taking low-dose Armour Thyroid 30 mg daily which is equivalent of 19 mcg of T4.  It is unlikely that this dose is keeping her thyroid hormone in the normal range. -She was given optionS of coming off of Armour Thyroid or staying  ON IT if it is helping symptomatically. -She decided to come off of it with plan to repeat  thyroid function test in 1 year. -If she is confirmed to have hypothyroidism, she will be approached for levothyroxine or Synthroid for the ease of dose adjustment in follow-up.  -The circumstance of her initial diagnosis are not available to review.  Review of available records in Epic  for the last 5 years did not indicate evidence of hypothyroidism.  Regarding her nodular goiter: She will not need biopsy or surgery at this time.  It is possible that some of her cough is coming from the asymmetric goiter she has L>R. She will be considered for repeat thyroid ultrasound in 1 year.  Weight management: Her BMI is 26 She is not a candidate for major weight loss.  -  Suggestion is made for her to avoid simple carbohydrates  from her diet including Cakes, Sweet Desserts / Pastries, Ice Cream, Soda (diet and regular), Sweet Tea, Candies, Chips, Cookies, Sweet Pastries,  Store Bought Juices, Alcohol in Excess of  1-2 drinks a day, Artificial Sweeteners, Coffee Creamer, and "Sugar-free" Products. This will help patient to  avoid unintended weight gain.  - I did not initiate any new prescriptions today. - she is advised to maintain close follow up with Madison Drown, MD for primary care needs.     - Time spent on this patient care encounter:  20 minutes of which 50% was spent in  counseling and the rest reviewing  her current and  previous labs / studies and medications  doses and developing a plan for long term care. Madison Reyes  participated in the discussions, expressed understanding, and voiced agreement with the above plans.  All questions were answered to her satisfaction. she is encouraged to contact clinic should she have any questions or concerns prior to her return visit.   Follow up plan: Return in about 1 year (around 01/22/2021) for F/U with Pre-visit Labs, Thyroid / Neck Ultrasound.   Glade Lloyd, MD [facility] [address] [place], [place] [address] Phone: [phone]  Fax: [phone]     01/23/2020, 4:34 PM  This note was partially dictated with voice recognition software. Similar sounding words can be transcribed inadequately or may not  be corrected upon review.

## 2020-01-23 NOTE — Telephone Encounter (Signed)
Pt contacted and is still taking medication. Pt states she believes she is going to try to stay on med until physical. Pt wanted med to go to [facility] instead of [facility]. Sent 3 months worth to [facility].

## 2020-01-23 NOTE — Telephone Encounter (Signed)
1.  Please confirm with patient she is still taking this 2.  If she is she may have 3 months of refill

## 2020-01-30 ENCOUNTER — Encounter: Payer: Self-pay | Admitting: "Endocrinology

## 2020-03-17 ENCOUNTER — Other Ambulatory Visit: Payer: Self-pay | Admitting: Family Medicine

## 2020-04-08 ENCOUNTER — Telehealth: Payer: Self-pay | Admitting: Family Medicine

## 2020-04-08 ENCOUNTER — Other Ambulatory Visit: Payer: Self-pay | Admitting: *Deleted

## 2020-04-08 MED ORDER — BUPROPION HCL ER (XL) 300 MG PO TB24
300.0000 mg | ORAL_TABLET | Freq: Every day | ORAL | 1 refills | Status: DC
Start: 2020-04-08 — End: 2020-06-10

## 2020-04-08 NOTE — Telephone Encounter (Signed)
Has appt in September.  She said her Welbutrin was changed to 150 but she thinks she does better on the 300 mg and would like to go back up to that, at least until she can come in an talk to Dr. Nicki Reaper about it.  [place]

## 2020-04-08 NOTE — Telephone Encounter (Signed)
Discussed with pt and med sent to pharm.  

## 2020-04-08 NOTE — Telephone Encounter (Signed)
May switch to Wellbutrin XL 300 mg 1 daily #30 with 1 refill Follow-up sooner if any problems otherwise keep follow-up in September

## 2020-05-16 ENCOUNTER — Other Ambulatory Visit: Payer: Self-pay

## 2020-05-16 ENCOUNTER — Ambulatory Visit (INDEPENDENT_AMBULATORY_CARE_PROVIDER_SITE_OTHER): Payer: Commercial Managed Care - PPO | Admitting: Nurse Practitioner

## 2020-05-16 VITALS — BP 118/76 | Temp 97.0°F | Ht 65.5 in | Wt 163.2 lb

## 2020-05-16 DIAGNOSIS — Z79899 Other long term (current) drug therapy: Secondary | ICD-10-CM | POA: Diagnosis not present

## 2020-05-16 DIAGNOSIS — E7800 Pure hypercholesterolemia, unspecified: Secondary | ICD-10-CM

## 2020-05-16 DIAGNOSIS — Z0000 Encounter for general adult medical examination without abnormal findings: Secondary | ICD-10-CM | POA: Diagnosis not present

## 2020-05-16 DIAGNOSIS — R5383 Other fatigue: Secondary | ICD-10-CM | POA: Diagnosis not present

## 2020-05-16 DIAGNOSIS — J310 Chronic rhinitis: Secondary | ICD-10-CM

## 2020-05-16 DIAGNOSIS — G4700 Insomnia, unspecified: Secondary | ICD-10-CM

## 2020-05-16 NOTE — Progress Notes (Signed)
Subjective:    Subjective   Patient ID: Madison Reyes, female    DOB: 12-18-1960, 59 y.o.   MRN: [id number]  HPI  The patient comes in today for a wellness visit. She is also closely followed by urology and endocrinology. She reports recent 1 cm goiter nodule finding by endocrinologist. She was taken off thyroid medication 12/2019.  Patient reports feeling much better off her thyroid medication but does still have chronic fatigue . She feels this is related to her insomnia.  She is s/p hysterectomy and currently receives True Bio HRT every 3-4 months. She does desire to lose weight at this time. Her desired weight is 140 lbs. Patient feels she eat a fairly healthy diet and walks 2-3 miles daily. She recently resumed Wellbutrin at 300mg  after trialing a taper to 150 mg. She reports she feels much better on the 300mg  and wishes to continue at that dose. She denies depressive symptoms today. Urology recently fitted her for a pessary due to organ prolapse. She reports no discomfort from use and feels it has helped tremendously with her incontinence. She has been fully vaccinated for COVID 19. She reports ear fullness since vaccination but has experienced seasonal allergies in the past. Familial history significant for colon cancer: 1 sister/ 4 aunts (deceased) and uterine cancer: 1 sister diagnosed at age 16 (living). She has completed both her colonoscopy and mammogram. She decline the influenza vaccination this visit. Routine dental and eye exams completed this year. She denies ETOH, tobacco, or illicit drug use.  A review of their health history was completed. A review of medications was also completed.  Any needed refills; no  Eating habits: eating good  Falls/  MVA accidents in past few months: none  Regular exercise: yes- walking  Specialist pt sees on regular basis: Dietitian and Hormone Replacement Specialist  Preventative health issues were discussed.    Additional concerns: none  Depression screen [facility] 2/9 05/16/2020 05/09/2019 05/03/2018 03/08/2018 11/01/2017  Decreased Interest 0 0 0 1 2  Down, Depressed, Hopeless 0 1 1 1 1   PHQ - 2 Score 0 1 1 2 3   Altered sleeping - 1 1 2 2   Tired, decreased energy - 1 1 2 2   Change in appetite - 0 0 1 0  Feeling bad or failure about yourself  - 0 1 1 1   Trouble concentrating - 0 1 1 2   Moving slowly or fidgety/restless - 0 0 0 2  Suicidal thoughts - 0 - 0 0  PHQ-9 Score - 3 5 9 12   Difficult doing work/chores - Not difficult at all Not difficult at all Not difficult at all Somewhat difficult     Review of Systems  Review of Systems  Constitutional: Positive for malaise/fatigue. Negative for chills, fever and weight loss.  HENT: Positive for ear pain and sore throat. Negative for hearing loss.   Eyes: Negative for blurred vision.  Respiratory: Negative for cough, shortness of breath and wheezing.   Cardiovascular: Negative for chest pain and palpitations.  Gastrointestinal: Negative for abdominal pain, constipation, diarrhea, heartburn, nausea and vomiting.  Genitourinary: Negative for dysuria.  Musculoskeletal: Negative for back pain, falls, joint pain and neck pain.  Neurological: Negative for dizziness, tingling, weakness and headaches.  Psychiatric/Behavioral: Negative for depression, substance abuse and suicidal ideas. The patient has insomnia. The patient is not nervous/anxious.       Objective:   Today's Vitals   05/16/20 1539  BP: 118/76  Temp: (!) 97  F (36.1 C)  TempSrc: Oral  Weight: 163 lb 3.2 oz (74 kg)  Height: 5' 5.5" (1.664 m)   Body mass index is 26.74 kg/m.  Physical Exam Constitutional:      General: She is not in acute distress.    Appearance: Normal appearance. She is normal weight. She is not ill-appearing.  HENT:     Head: Normocephalic.     Right Ear: No tenderness. Tympanic membrane is retracted. Tympanic membrane is not erythematous.     Left  Ear: No tenderness. Tympanic membrane is retracted. Tympanic membrane is not erythematous.     Nose: No congestion.     Mouth/Throat:     Mouth: Mucous membranes are moist.     Pharynx: No pharyngeal swelling or oropharyngeal exudate.     Comments: Pharynx mildly injected.  Eyes:     General:        Right eye: No discharge.        Left eye: No discharge.     Conjunctiva/sclera: Conjunctivae normal.  Cardiovascular:     Rate and Rhythm: Normal rate and regular rhythm.     Pulses: Normal pulses.     Heart sounds: Normal heart sounds. No murmur heard.  No gallop.   Pulmonary:     Effort: Pulmonary effort is normal. No respiratory distress.     Breath sounds: Normal breath sounds. No wheezing or rhonchi.  Musculoskeletal:        General: No swelling.     Right lower leg: No edema.     Left lower leg: No edema.  Skin:    General: Skin is warm and dry.  Neurological:     Mental Status: She is alert and oriented to person, place, and time.  Psychiatric:        Mood and Affect: Mood normal.        Behavior: Behavior normal.        Thought Content: Thought content normal.        Judgment: Judgment normal.      Assessment & Plan:   Problem List Items Addressed This Visit      Other   Fatigue   Relevant Orders   Comprehensive metabolic panel   CBC with Differential/Platelet   VITAMIN D 25 Hydroxy (Vit-D Deficiency, Fractures)   Hypercholesterolemia   Relevant Orders   Lipid panel    Other Visit Diagnoses    Routine general medical examination at a health care facility    -  Primary   High risk medication use       Relevant Orders   Comprehensive metabolic panel   Insomnia, unspecified type       Mixed rhinitis          Melatonin 10 mg QHS as needed to help with sleep Flonase nasal spray as directed for sinus pressure prn Claritin or Allegra OTC as directed prn Labs pending.  Discussed lifestyle modifications for desired weight loss goal. (Restrict sugar and fried  foods) Continue Wellbutrin as prescribed.  Return if symptoms worsen or fail to improve. Continue follow up with specialists as planned.

## 2020-05-16 NOTE — Patient Instructions (Signed)
Melatonin 10 mg OTC for sleep Flonase nasal spray Claritin or Allegra for Sinus Pressure

## 2020-05-16 NOTE — Progress Notes (Signed)
° °  Subjective:    Patient ID: Madison Reyes, female    DOB: [birth year], 59 y.o.   MRN: [id number]  HPI  The patient comes in today for a wellness visit.    A review of their health history was completed.  A review of medications was also completed.  Any needed refills; no  Eating habits: eating good  Falls/  MVA accidents in past few months: none  Regular exercise: yes- walking  Specialist pt sees on regular basis: endocrinologist and hormone replacement dr  Preventative health issues were discussed.   Additional concerns: none    Review of Systems     Objective:   Physical Exam        Assessment & Plan:

## 2020-05-18 ENCOUNTER — Encounter: Payer: Self-pay | Admitting: Nurse Practitioner

## 2020-05-22 ENCOUNTER — Other Ambulatory Visit: Payer: Commercial Managed Care - PPO

## 2020-05-22 DIAGNOSIS — Z20822 Contact with and (suspected) exposure to covid-19: Secondary | ICD-10-CM

## 2020-05-23 LAB — SARS-COV-2, NAA 2 DAY TAT

## 2020-05-23 LAB — NOVEL CORONAVIRUS, NAA: SARS-CoV-2, NAA: NOT DETECTED

## 2020-05-24 LAB — CBC WITH DIFFERENTIAL/PLATELET
Basophils Absolute: 0 10*3/uL (ref 0.0–0.2)
Basos: 1 %
EOS (ABSOLUTE): 0.2 10*3/uL (ref 0.0–0.4)
Eos: 2 %
Hematocrit: 37.7 % (ref 34.0–46.6)
Hemoglobin: 12.7 g/dL (ref 11.1–15.9)
Immature Grans (Abs): 0 10*3/uL (ref 0.0–0.1)
Immature Granulocytes: 0 %
Lymphocytes Absolute: 2.7 10*3/uL (ref 0.7–3.1)
Lymphs: 37 %
MCH: 32.2 pg (ref 26.6–33.0)
MCHC: 33.7 g/dL (ref 31.5–35.7)
MCV: 95 fL (ref 79–97)
Monocytes Absolute: 0.6 10*3/uL (ref 0.1–0.9)
Monocytes: 9 %
Neutrophils Absolute: 3.7 10*3/uL (ref 1.4–7.0)
Neutrophils: 51 %
Platelets: 230 10*3/uL (ref 150–450)
RBC: 3.95 x10E6/uL (ref 3.77–5.28)
RDW: 12.6 % (ref 11.7–15.4)
WBC: 7.2 10*3/uL (ref 3.4–10.8)

## 2020-05-24 LAB — LIPID PANEL
Chol/HDL Ratio: 4.8 ratio — ABNORMAL HIGH (ref 0.0–4.4)
Cholesterol, Total: 260 mg/dL — ABNORMAL HIGH (ref 100–199)
HDL: 54 mg/dL (ref >39)
LDL Chol Calc (NIH): 179 mg/dL — ABNORMAL HIGH (ref 0–99)
Triglycerides: 147 mg/dL (ref 0–149)
VLDL Cholesterol Cal: 27 mg/dL (ref 5–40)

## 2020-05-24 LAB — COMPREHENSIVE METABOLIC PANEL
ALT: 14 IU/L (ref 0–32)
AST: 17 IU/L (ref 0–40)
Albumin/Globulin Ratio: 1.7 (ref 1.2–2.2)
Albumin: 4.3 g/dL (ref 3.8–4.9)
Alkaline Phosphatase: 48 IU/L (ref 44–121)
BUN/Creatinine Ratio: 14 (ref 9–23)
BUN: 11 mg/dL (ref 6–24)
Bilirubin Total: 0.9 mg/dL (ref 0.0–1.2)
CO2: 24 mmol/L (ref 20–29)
Calcium: 9.5 mg/dL (ref 8.7–10.2)
Chloride: 102 mmol/L (ref 96–106)
Creatinine, Ser: 0.8 mg/dL (ref 0.57–1.00)
GFR calc Af Amer: 94 mL/min/{1.73_m2} (ref >59)
GFR calc non Af Amer: 82 mL/min/{1.73_m2} (ref >59)
Globulin, Total: 2.6 g/dL (ref 1.5–4.5)
Glucose: 78 mg/dL (ref 65–99)
Potassium: 4.1 mmol/L (ref 3.5–5.2)
Sodium: 138 mmol/L (ref 134–144)
Total Protein: 6.9 g/dL (ref 6.0–8.5)

## 2020-05-24 LAB — VITAMIN D 25 HYDROXY (VIT D DEFICIENCY, FRACTURES): Vit D, 25-Hydroxy: 54.5 ng/mL (ref 30.0–100.0)

## 2020-06-09 ENCOUNTER — Other Ambulatory Visit: Payer: Self-pay | Admitting: Family Medicine

## 2020-06-20 ENCOUNTER — Other Ambulatory Visit: Payer: Self-pay | Admitting: Nurse Practitioner

## 2020-06-20 ENCOUNTER — Encounter: Payer: Self-pay | Admitting: Nurse Practitioner

## 2020-06-20 DIAGNOSIS — N62 Hypertrophy of breast: Secondary | ICD-10-CM

## 2020-07-11 ENCOUNTER — Other Ambulatory Visit: Payer: Self-pay | Admitting: Family Medicine

## 2020-07-25 ENCOUNTER — Encounter: Payer: Self-pay | Admitting: Nurse Practitioner

## 2020-08-05 ENCOUNTER — Other Ambulatory Visit: Payer: Commercial Managed Care - PPO

## 2020-08-05 ENCOUNTER — Other Ambulatory Visit: Payer: Self-pay

## 2020-08-05 DIAGNOSIS — Z20822 Contact with and (suspected) exposure to covid-19: Secondary | ICD-10-CM

## 2020-08-06 LAB — SARS-COV-2, NAA 2 DAY TAT

## 2020-08-06 LAB — SPECIMEN STATUS REPORT

## 2020-08-06 LAB — NOVEL CORONAVIRUS, NAA: SARS-CoV-2, NAA: NOT DETECTED

## 2020-10-10 ENCOUNTER — Other Ambulatory Visit: Payer: Self-pay | Admitting: Family Medicine

## 2020-10-10 DIAGNOSIS — Z1231 Encounter for screening mammogram for malignant neoplasm of breast: Secondary | ICD-10-CM

## 2020-10-12 ENCOUNTER — Other Ambulatory Visit: Payer: Self-pay | Admitting: Nurse Practitioner

## 2020-11-06 ENCOUNTER — Ambulatory Visit: Payer: Commercial Managed Care - PPO | Attending: Critical Care Medicine

## 2020-11-06 DIAGNOSIS — Z20822 Contact with and (suspected) exposure to covid-19: Secondary | ICD-10-CM

## 2020-11-07 LAB — SARS-COV-2, NAA 2 DAY TAT

## 2020-11-07 LAB — NOVEL CORONAVIRUS, NAA: SARS-CoV-2, NAA: NOT DETECTED

## 2021-01-06 ENCOUNTER — Ambulatory Visit (HOSPITAL_COMMUNITY)
Admission: RE | Admit: 2021-01-06 | Discharge: 2021-01-06 | Disposition: A | Discharge status: Home / Self Care | Payer: Commercial Managed Care - PPO | Source: Ambulatory Visit | Attending: "Endocrinology | Admitting: "Endocrinology

## 2021-01-06 ENCOUNTER — Other Ambulatory Visit: Payer: Self-pay

## 2021-01-06 DIAGNOSIS — E042 Nontoxic multinodular goiter: Secondary | ICD-10-CM | POA: Insufficient documentation

## 2021-01-12 ENCOUNTER — Telehealth: Payer: Self-pay

## 2021-01-12 ENCOUNTER — Other Ambulatory Visit: Payer: Self-pay

## 2021-01-12 DIAGNOSIS — E039 Hypothyroidism, unspecified: Secondary | ICD-10-CM

## 2021-01-12 NOTE — Telephone Encounter (Signed)
Patient said do you need her back sooner than a year? Please Advise

## 2021-01-12 NOTE — Telephone Encounter (Signed)
Can you update lab orders for pt for [facility]

## 2021-01-16 ENCOUNTER — Other Ambulatory Visit: Payer: Self-pay

## 2021-01-16 ENCOUNTER — Ambulatory Visit (INDEPENDENT_AMBULATORY_CARE_PROVIDER_SITE_OTHER): Payer: Commercial Managed Care - PPO | Admitting: Family Medicine

## 2021-01-16 VITALS — BP 116/74 | HR 75 | Temp 97.5°F | Ht 65.5 in | Wt 160.0 lb

## 2021-01-16 DIAGNOSIS — L259 Unspecified contact dermatitis, unspecified cause: Secondary | ICD-10-CM

## 2021-01-16 MED ORDER — PREDNISONE 20 MG PO TABS: 40.0000 mg | ORAL_TABLET | Freq: Every day | ORAL | 0 refills | Status: DC

## 2021-01-16 MED ORDER — TRIAMCINOLONE ACETONIDE 0.1 % EX CREA: 1.0000 "application " | TOPICAL_CREAM | Freq: Two times a day (BID) | CUTANEOUS | 0 refills | Status: DC

## 2021-01-16 MED ORDER — METHYLPREDNISOLONE ACETATE 40 MG/ML IJ SUSP
40.0000 mg | Freq: Once | INTRAMUSCULAR | Status: AC
  Administered 2021-01-16: 40 mg via INTRAMUSCULAR

## 2021-01-16 NOTE — Progress Notes (Signed)
Patient ID: Madison Reyes, female    DOB: [birth year], 60 y.o.   MRN: [id number]   Chief Complaint  Patient presents with  . rash on skin     X 3 days all over body - has tried different OTC lotions and zyrtec   Subjective:    HPI Having a rash for past 3 days.  Never had any rashes in past. Covering most of her body but sparing the face. No new detergent, lotions or soaps. Using clear tide detergent.  Taking zyrtec and 1%hydrocortiosone.  Used aveeno oatmeal bath.  No one else has it at home.  Not been out in woods or in garden.    Medical History Kamali has a past medical history of Anxiety, Fibroids, H/O dysmenorrhea, H/O menorrhagia, High cholesterol, Menopausal symptoms, and Migraine.   Outpatient Encounter Medications as of 01/16/2021  Medication Sig  . buPROPion (WELLBUTRIN XL) 300 MG 24 hr tablet TAKE 1 TABLET BY MOUTH ONCE DAILY.  Marland Kitchen CALCIUM PO Take by mouth.  . cetirizine (ZYRTEC) 10 MG tablet Take 10 mg by mouth daily.  . Cholecalciferol (VITAMIN D3) 50 MCG (2000 UT) TABS Take by mouth.  . Multiple Vitamins-Minerals (MULTIVITAMIN PO) Take by mouth.  . predniSONE (DELTASONE) 20 MG tablet Take 2 tablets (40 mg total) by mouth daily with breakfast.  . triamcinolone cream (KENALOG) 0.1 % Apply 1 application topically 2 (two) times daily.  . vitamin B-12 (CYANOCOBALAMIN) 100 MCG tablet Take 100 mcg by mouth daily.  . vitamin k 100 MCG tablet Take 100 mcg by mouth daily.  . [DISCONTINUED] minocycline (MINOCIN) 100 MG capsule    Facility-Administered Encounter Medications as of 01/16/2021  Medication  . methylPREDNISolone acetate (DEPO-MEDROL) injection 40 mg     Review of Systems  Constitutional: Negative for chills and fever.  HENT: Negative for congestion, rhinorrhea and sore throat.   Respiratory: Negative for cough, shortness of breath and wheezing.   Cardiovascular: Negative for chest pain and leg swelling.  Gastrointestinal: Negative for abdominal pain,  diarrhea, nausea and vomiting.  Genitourinary: Negative for dysuria and frequency.  Musculoskeletal: Negative for arthralgias and back pain.  Skin: Positive for rash.  Neurological: Negative for dizziness, weakness and headaches.     Vitals BP 116/74   Pulse 75   Temp (!) 97.5 F (36.4 C)   Ht 5' 5.5" (1.664 m)   Wt 160 lb (72.6 kg)   SpO2 100%   BMI 26.22 kg/m   Objective:   Physical Exam Vitals and nursing note reviewed.  Constitutional:      General: She is not in acute distress.    Appearance: Normal appearance. She is not ill-appearing.  HENT:     Mouth/Throat:     Mouth: Mucous membranes are moist.     Pharynx: Oropharynx is clear.  Eyes:     Extraocular Movements: Extraocular movements intact.     Conjunctiva/sclera: Conjunctivae normal.     Pupils: Pupils are equal, round, and reactive to light.  Pulmonary:     Effort: Pulmonary effort is normal.     Breath sounds: Normal breath sounds.  Musculoskeletal:        General: Normal range of motion.     Right lower leg: No edema.     Left lower leg: No edema.  Skin:    General: Skin is warm and dry.     Findings: Rash (erythematous rash and small papules on bilateral arms, abd, neck, back, legs no blistering or weeping) present. No  lesion.  Neurological:     General: No focal deficit present.     Mental Status: She is alert and oriented to person, place, and time.  Psychiatric:        Mood and Affect: Mood normal.        Behavior: Behavior normal.     Assessment and Plan   1. Contact dermatitis, unspecified contact dermatitis type, unspecified trigger - cetirizine (ZYRTEC) 10 MG tablet; Take 10 mg by mouth daily. - triamcinolone cream (KENALOG) 0.1 %; Apply 1 application topically 2 (two) times daily.  Dispense: 60 g; Refill: 0 - predniSONE (DELTASONE) 20 MG tablet; Take 2 tablets (40 mg total) by mouth daily with breakfast.  Dispense: 10 tablet; Refill: 0 - methylPREDNISolone acetate (DEPO-MEDROL)  injection 40 mg    Pt requesting steroid injection to help with her dermatitis.  Will give 40mg  prednisone for 5 days to take starting tomorrow. Also can take benadryl prn for sleeping if severe itching. Pt in agreement with plan.  No follow-ups on file.   01/16/2021

## 2021-01-19 ENCOUNTER — Telehealth: Payer: Self-pay | Admitting: Family Medicine

## 2021-01-19 NOTE — Telephone Encounter (Signed)
Nurses\  I received a request from [facility].  Apparently she is having surgery on her hip coming up within the next few weeks.  They are requesting medical release/clearance  In order to do this I will need to see the patient for preoperative evaluation. I am gone next week. I would recommend that we make this appointment this week.  Limited slots available.  Recommend wherever there is an opening or at the end of the morning or end of the afternoon Please connect with patient please get set up thank you

## 2021-01-20 NOTE — Telephone Encounter (Signed)
Patient scheduled office visit 01/22/21 at 11 am with Dr Nicki Reaper for preop clearance.

## 2021-01-20 NOTE — Telephone Encounter (Signed)
Left message to return call ( needs  preop clearance- has opening 01/22/21 at 11 am)

## 2021-01-21 HISTORY — PX: TOTAL HIP ARTHROPLASTY: SHX124

## 2021-01-22 ENCOUNTER — Other Ambulatory Visit: Payer: Self-pay

## 2021-01-22 ENCOUNTER — Ambulatory Visit (INDEPENDENT_AMBULATORY_CARE_PROVIDER_SITE_OTHER): Payer: Commercial Managed Care - PPO | Admitting: Family Medicine

## 2021-01-22 ENCOUNTER — Encounter: Payer: Self-pay | Admitting: Family Medicine

## 2021-01-22 VITALS — BP 124/78 | HR 84 | Temp 98.1°F | Ht 65.5 in | Wt 160.0 lb

## 2021-01-22 DIAGNOSIS — Z01818 Encounter for other preprocedural examination: Secondary | ICD-10-CM | POA: Diagnosis not present

## 2021-01-22 DIAGNOSIS — M25552 Pain in left hip: Secondary | ICD-10-CM

## 2021-01-22 LAB — TSH: TSH: 1.9 (ref 0.41–5.90)

## 2021-01-22 NOTE — Progress Notes (Signed)
   Subjective:    Patient ID: Madison Reyes, female    DOB: 08/26/1960, 60 y.o.   MRN: [id number]  HPIsurgical clearance for left hip surgery. Scheduled for June 24th at [facility].  Patient is able to do walking but afterwards her hip hurts she denies any chest tightness pressure pain with walking she is capable of fast-paced walking and moderate activity without having shortness of breath or chest pain  She has had multiple surgeries in the past and is never had a problem with anesthesia or aftereffects of anesthesia. She does not have any history of blood clots and no family history of abnormal interactions with anesthesia  Her overall health is very good she is health-conscious tries to eat healthy tries to keep her weight and check blood pressure has been good Review of Systems Please see above    Objective:   Physical Exam Lungs are clear respiratory rate normal heart is regular pulses normal extremities no edema skin warm dry neurologic grossly normal blood pressure got       Assessment & Plan:  1. Left hip pain She is having left hip surgery for replacement of the hip overall this should go well but patient is aware that it is surgery and therefore problems can occur.  She was encouraged to talk with her surgeon about blood clot prevention and what is the latest recommendation from a orthopedic standpoint certainly staying physically active after surgery is important  She was encouraged to keep up with her female health checkup physicals 2. Pre-op evaluation Patient is considered to be low risk.  Lab work ordered patient has asked for A1c to be checked along with the standard labs.  We will forward the information to her orthopedist once this comes in - Hemoglobin U7M - Basic metabolic panel - CBC with Differential/Platelet - Protime-INR

## 2021-01-23 LAB — CBC WITH DIFFERENTIAL/PLATELET
Basophils Absolute: 0.1 10*3/uL (ref 0.0–0.2)
Basos: 1 %
EOS (ABSOLUTE): 0.3 10*3/uL (ref 0.0–0.4)
Eos: 3 %
Hematocrit: 38.2 % (ref 34.0–46.6)
Hemoglobin: 12.8 g/dL (ref 11.1–15.9)
Immature Grans (Abs): 0.1 10*3/uL (ref 0.0–0.1)
Immature Granulocytes: 1 %
Lymphocytes Absolute: 3.9 10*3/uL — ABNORMAL HIGH (ref 0.7–3.1)
Lymphs: 38 %
MCH: 32.9 pg (ref 26.6–33.0)
MCHC: 33.5 g/dL (ref 31.5–35.7)
MCV: 98 fL — ABNORMAL HIGH (ref 79–97)
Monocytes Absolute: 0.5 10*3/uL (ref 0.1–0.9)
Monocytes: 5 %
Neutrophils Absolute: 5.5 10*3/uL (ref 1.4–7.0)
Neutrophils: 52 %
Platelets: 237 10*3/uL (ref 150–450)
RBC: 3.89 x10E6/uL (ref 3.77–5.28)
RDW: 12.5 % (ref 11.7–15.4)
WBC: 10.3 10*3/uL (ref 3.4–10.8)

## 2021-01-23 LAB — PROTIME-INR
INR: 1 (ref 0.9–1.2)
Prothrombin Time: 10.5 s (ref 9.1–12.0)

## 2021-01-23 LAB — BASIC METABOLIC PANEL
BUN/Creatinine Ratio: 18 (ref 9–23)
BUN: 14 mg/dL (ref 6–24)
CO2: 24 mmol/L (ref 20–29)
Calcium: 9.5 mg/dL (ref 8.7–10.2)
Chloride: 105 mmol/L (ref 96–106)
Creatinine, Ser: 0.8 mg/dL (ref 0.57–1.00)
Glucose: 70 mg/dL (ref 65–99)
Potassium: 3.6 mmol/L (ref 3.5–5.2)
Sodium: 143 mmol/L (ref 134–144)
eGFR: 85 mL/min/{1.73_m2} (ref >59)

## 2021-01-23 LAB — HEMOGLOBIN A1C
Est. average glucose Bld gHb Est-mCnc: 100 mg/dL
Hgb A1c MFr Bld: 5.1 % (ref 4.8–5.6)

## 2021-01-26 ENCOUNTER — Other Ambulatory Visit: Payer: Self-pay

## 2021-01-26 ENCOUNTER — Encounter: Payer: Self-pay | Admitting: "Endocrinology

## 2021-01-26 ENCOUNTER — Ambulatory Visit (INDEPENDENT_AMBULATORY_CARE_PROVIDER_SITE_OTHER): Payer: Commercial Managed Care - PPO | Admitting: "Endocrinology

## 2021-01-26 VITALS — BP 126/78 | HR 76 | Ht 65.5 in | Wt 156.2 lb

## 2021-01-26 DIAGNOSIS — E042 Nontoxic multinodular goiter: Secondary | ICD-10-CM | POA: Insufficient documentation

## 2021-01-26 NOTE — Progress Notes (Signed)
01/26/2021, 12:34 PM  Endocrinology follow-up note   Subjective:    Patient ID: Madison Reyes, female    DOB: 06-Mar-1961, PCP Kathyrn Drown, MD   Past Medical History:  Diagnosis Date  . Anxiety   . Fibroids    h/o  . H/O dysmenorrhea   . H/O menorrhagia   . High cholesterol   . Menopausal symptoms    h/o  . Migraine    Past Surgical History:  Procedure Laterality Date  . ABDOMINAL HYSTERECTOMY    . BACK SURGERY  2015   disc removal  . LIPOMA EXCISION    . MYOMECTOMY    . ovarian fibroid    . UTERINE FIBROID SURGERY     Social History   Socioeconomic History  . Marital status: Married    Spouse name: Not on file  . Number of children: 1  . Years of education: Not on file  . Highest education level: Bachelor's degree (e.g., BA, AB, BS)  Occupational History    Employer: [employer]  Tobacco Use  . Smoking status: Never Smoker  . Smokeless tobacco: Never Used  Substance and Sexual Activity  . Alcohol use: Never  . Drug use: Never  . Sexual activity: Yes    Birth control/protection: None    Comment: no cycles; pt had hysterectomy  Other Topics Concern  . Not on file  Social History Narrative   Lives at home with husband Ricky   Caffeine: none   Social Determinants of Health   Financial Resource Strain: Not on file  Food Insecurity: Not on file  Transportation Needs: Not on file  Physical Activity: Not on file  Stress: Not on file  Social Connections: Not on file   Family History  Problem Relation Age of Onset  . Stroke Paternal Grandfather   . Pneumonia Paternal Grandmother   . Cancer Father        bone marrow cancer  . Prostate cancer Father   . COPD Father   . Heart disease Father   . Dementia Mother   . Hypertension Mother   . Heart disease Mother   . Cancer Sister        uterine  . Breast cancer Neg Hx    Outpatient Encounter Medications as of 01/26/2021   Medication Sig  . buPROPion (WELLBUTRIN XL) 300 MG 24 hr tablet TAKE 1 TABLET BY MOUTH ONCE DAILY.  Marland Kitchen CALCIUM PO Take by mouth.  . cetirizine (ZYRTEC) 10 MG tablet Take 10 mg by mouth daily.  . Cholecalciferol (VITAMIN D3) 50 MCG (2000 UT) TABS Take by mouth. (Patient not taking: Reported on 01/26/2021)  . Multiple Vitamins-Minerals (MULTIVITAMIN PO) Take by mouth.  . triamcinolone cream (KENALOG) 0.1 % Apply 1 application topically 2 (two) times daily. (Patient not taking: Reported on 01/26/2021)  . vitamin B-12 (CYANOCOBALAMIN) 100 MCG tablet Take 100 mcg by mouth daily.  . vitamin k 100 MCG tablet Take 100 mcg by mouth daily.   No facility-administered encounter medications on file as of 01/26/2021.   ALLERGIES: Allergies  Allergen Reactions  . Aspirin     "stomach sensitivity"    VACCINATION STATUS:  There is no immunization history on file for  this patient.  HPI Madison Reyes is 60 y.o. female who presents today with a medical history as above. she is being seen in follow-up after she was seen in consultation for hypothyroidism requested by Kathyrn Drown, MD.   Since her last visit in June 2021, she stayed off of any thyroid hormone supplements.  Her previsit labs are consistent with euthyroid state.  She was treated with low-dose Armour Thyroid previously.    -She denies palpitations, tremors, heat/cold intolerance. -She did not have acute complaints today.  She lost 5 pounds intentionally.  She reports fluctuating body weight.  She has family history of hypothyroidism in one of her siblings, goiter which required surgery in her mother.  She denies any family history of thyroid malignancy.   Review of Systems  Constitutional: + Steady weight, + fatigue, no subjective hyperthermia, no subjective hypothermia   Objective:    Vitals with BMI 01/26/2021 01/22/2021 01/16/2021  Height 5' 5.5" 5' 5.5" 5' 5.5"  Weight 156 lbs 3 oz 160 lbs 160 lbs  BMI 25.59 71.69 67.89  Systolic 381  017 510  Diastolic 78 78 74  Pulse 76 84 75    BP 126/78   Pulse 76   Ht 5' 5.5" (1.664 m)   Wt 156 lb 3.2 oz (70.9 kg)   BMI 25.60 kg/m   Wt Readings from Last 3 Encounters:  01/26/21 156 lb 3.2 oz (70.9 kg)  01/22/21 160 lb (72.6 kg)  01/16/21 160 lb (72.6 kg)    Physical Exam   Constitutional:  Body mass index is 25.6 kg/m.,  not in acute distress, normal state of mind Eyes: PERRLA, EOMI, no exophthalmos ENT: moist mucous membranes, + gross thyromegaly, no gross cervical lymphadenopathy  Thyroid ultrasound from Jan 06, 2021: Right lobe 5.2 cm, left lobe 5.1 cm.  1.2 cm nodule in the left mid lobe.  No suspicious features, meets criteria for continued surveillance.  CMP ( most recent) CMP     Component Value Date/Time   NA 143 01/22/2021 1142   K 3.6 01/22/2021 1142   CL 105 01/22/2021 1142   CO2 24 01/22/2021 1142   GLUCOSE 70 01/22/2021 1142   GLUCOSE 88 04/03/2013 0740   BUN 14 01/22/2021 1142   CREATININE 0.80 01/22/2021 1142   CREATININE 0.85 04/03/2013 0740   CALCIUM 9.5 01/22/2021 1142   PROT 6.9 05/23/2020 0852   ALBUMIN 4.3 05/23/2020 0852   AST 17 05/23/2020 0852   ALT 14 05/23/2020 0852   ALKPHOS 48 05/23/2020 0852   BILITOT 0.9 05/23/2020 0852   GFRNONAA 82 05/23/2020 0852   GFRAA 94 05/23/2020 0852     Diabetic Labs (most recent): Lab Results  Component Value Date   HGBA1C 5.1 01/22/2021   HGBA1C 5.1% 11/19/2017   HGBA1C 5.0 11/05/2017     Lipid Panel ( most recent) Lipid Panel     Component Value Date/Time   CHOL 260 (H) 05/23/2020 0852   TRIG 147 05/23/2020 0852   HDL 54 05/23/2020 0852   CHOLHDL 4.8 (H) 05/23/2020 0852   CHOLHDL 4.3 04/03/2013 0740   VLDL 24 04/03/2013 0740   LDLCALC 179 (H) 05/23/2020 0852   LABVLDL 27 05/23/2020 0852     Recent Results (from the past 2160 hour(s))  Novel Coronavirus, NAA ([facility])     Status: None   Collection Time: 11/06/20  5:37 PM   Specimen: Nasopharyngeal(NP) swabs in vial transport  medium   Nasopharynge  Testing  Result Value Ref Range  SARS-CoV-2, NAA Not Detected Not Detected    Comment: This nucleic acid amplification test was developed and its performance characteristics determined by [employer]. Nucleic acid amplification tests include RT-PCR and TMA. This test has not been FDA cleared or approved. This test has been authorized by FDA under an Emergency Use Authorization (EUA). This test is only authorized for the duration of time the declaration that circumstances exist justifying the authorization of the emergency use of in vitro diagnostic tests for detection of SARS-CoV-2 virus and/or diagnosis of COVID-19 infection under section 564(b)(1) of the Act, 21 U.S.C. 938BOF-7(P) (1), unless the authorization is terminated or revoked sooner. When diagnostic testing is negative, the possibility of a false negative result should be considered in the context of a patient's recent exposures and the presence of clinical signs and symptoms consistent with COVID-19. An individual without symptoms of COVID-19 and who is not shedding SARS-CoV-2 virus wo uld expect to have a negative (not detected) result in this assay.   SARS-COV-2, NAA 2 DAY TAT     Status: None   Collection Time: 11/06/20  5:37 PM   Nasopharynge  Testing  Result Value Ref Range   SARS-CoV-2, NAA 2 DAY TAT Performed   TSH     Status: None   Collection Time: 01/22/21 12:00 AM  Result Value Ref Range   TSH 1.90 0.41 - 5.90    Comment: T4,Free 1.20, T3,Free 2.8  Hemoglobin A1c     Status: None   Collection Time: 01/22/21 11:42 AM  Result Value Ref Range   Hgb A1c MFr Bld 5.1 4.8 - 5.6 %    Comment:          Prediabetes: 5.7 - 6.4          Diabetes: >6.4          Glycemic control for adults with diabetes: <7.0    Est. average glucose Bld gHb Est-mCnc 100 mg/dL  Basic metabolic panel     Status: None   Collection Time: 01/22/21 11:42 AM  Result Value Ref Range   Glucose 70 65 - 99  mg/dL   BUN 14 6 - 24 mg/dL   Creatinine, Ser 0.80 0.57 - 1.00 mg/dL   eGFR 85 >59 mL/min/1.73   BUN/Creatinine Ratio 18 9 - 23   Sodium 143 134 - 144 mmol/L   Potassium 3.6 3.5 - 5.2 mmol/L   Chloride 105 96 - 106 mmol/L   CO2 24 20 - 29 mmol/L   Calcium 9.5 8.7 - 10.2 mg/dL  CBC with Differential/Platelet     Status: Abnormal   Collection Time: 01/22/21 11:42 AM  Result Value Ref Range   WBC 10.3 3.4 - 10.8 x10E3/uL   RBC 3.89 3.77 - 5.28 x10E6/uL   Hemoglobin 12.8 11.1 - 15.9 g/dL   Hematocrit 38.2 34.0 - 46.6 %   MCV 98 (H) 79 - 97 fL   MCH 32.9 26.6 - 33.0 pg   MCHC 33.5 31.5 - 35.7 g/dL   RDW 12.5 11.7 - 15.4 %   Platelets 237 150 - 450 x10E3/uL   Neutrophils 52 Not Estab. %   Lymphs 38 Not Estab. %   Monocytes 5 Not Estab. %   Eos 3 Not Estab. %   Basos 1 Not Estab. %   Neutrophils Absolute 5.5 1.4 - 7.0 x10E3/uL   Lymphocytes Absolute 3.9 (H) 0.7 - 3.1 x10E3/uL   Monocytes Absolute 0.5 0.1 - 0.9 x10E3/uL   EOS (ABSOLUTE) 0.3 0.0 - 0.4  x10E3/uL   Basophils Absolute 0.1 0.0 - 0.2 x10E3/uL   Immature Granulocytes 1 Not Estab. %   Immature Grans (Abs) 0.1 0.0 - 0.1 x10E3/uL  Protime-INR     Status: None   Collection Time: 01/22/21 11:42 AM  Result Value Ref Range   INR 1.0 0.9 - 1.2    Comment: Reference interval is for non-anticoagulated patients. Suggested INR therapeutic range for Vitamin K antagonist therapy:    Standard Dose (moderate intensity                   therapeutic range):       2.0 - 3.0    Higher intensity therapeutic range       2.5 - 3.5    Prothrombin Time 10.5 9.1 - 12.0 sec    Jan 11, 2020 thyroid ultrasound: Right lobe measured 5.6 cm x 1.6 cm x 1.4 cm with no nodules Left lobe measures 6.4 cm x 1.9 cm x 1.9 cm with 1 cm nonsuspicious nodule in the mid section.  Assessment & Plan:   1.  Nodular goiter  -I discussed her recent labs and thyroid ultrasound findings with her.  Her presentation is consistent with euthyroid state.  Small  nonsuspicious thyroid nodule measuring one-point centimeter on the left lobe.  She will not need intervention against her thyroid at this time.   -She will return in 1 year with repeat thyroid function test, and physical exam.  If physical exam is significant , she will be considered for surveillance thyroid ultrasound. She has achieved 5 pounds of progressive weight loss.  She is not a candidate for any major weight loss.  - she acknowledges that there is a room for improvement in her food and drink choices. - Suggestion is made for her to avoid simple carbohydrates  from her diet including Cakes, Sweet Desserts, Ice Cream, Soda (diet and regular), Sweet Tea, Candies, Chips, Cookies, Store Bought Juices, Alcohol in Excess of  1-2 drinks a day, Artificial Sweeteners,  Coffee Creamer, and "Sugar-free" Products, Lemonade. This will help patient to have more stable blood glucose profile and potentially avoid unintended weight gain.  - I did not initiate any new prescriptions today. - she is advised to maintain close follow up with Kathyrn Drown, MD for primary care needs.   I spent 25 minutes in the care of the patient today including review of labs from Thyroid Function, CMP, and other relevant labs ; imaging/biopsy records (current and previous including abstractions from other facilities); face-to-face time discussing  her lab results and symptoms, medications doses, her options of short and long term treatment based on the latest standards of care / guidelines;   and documenting the encounter.  Madison Reyes  participated in the discussions, expressed understanding, and voiced agreement with the above plans.  All questions were answered to her satisfaction. she is encouraged to contact clinic should she have any questions or concerns prior to her return visit.    Follow up plan: Return in about 1 year (around 01/26/2022) for F/U with Pre-visit Labs.   Glade Lloyd, MD [facility] [address] [place], [place] [address] Phone: [phone]  Fax: [phone]     01/26/2021, 12:34 PM  This note was partially dictated with voice recognition software. Similar sounding words can be transcribed inadequately or may not  be corrected upon review.

## 2021-01-27 ENCOUNTER — Telehealth: Payer: Self-pay | Admitting: *Deleted

## 2021-01-27 NOTE — Telephone Encounter (Signed)
Pt's labs are completed and form for pre operative risk assessment is in dr scott's folder. Pt has pre op appt with dr Morene Crocker on 6/15 and would like form with  medical notes and labs sent before then.

## 2021-02-01 NOTE — Telephone Encounter (Signed)
I signed off on the forms Please fax these on Monday to the orthopedist Please send the patient a MyChart message letting her know this has been completed thank you

## 2021-02-06 NOTE — Telephone Encounter (Signed)
error 

## 2021-02-12 NOTE — Progress Notes (Signed)
Encounter created in error

## 2021-02-16 ENCOUNTER — Ambulatory Visit (HOSPITAL_COMMUNITY): Payer: Commercial Managed Care - PPO

## 2021-02-17 ENCOUNTER — Ambulatory Visit (HOSPITAL_COMMUNITY): Payer: Commercial Managed Care - PPO | Attending: Orthopedic Surgery

## 2021-02-17 ENCOUNTER — Other Ambulatory Visit: Payer: Self-pay

## 2021-02-17 DIAGNOSIS — R2689 Other abnormalities of gait and mobility: Secondary | ICD-10-CM | POA: Diagnosis present

## 2021-02-17 DIAGNOSIS — M6281 Muscle weakness (generalized): Secondary | ICD-10-CM

## 2021-02-17 DIAGNOSIS — R262 Difficulty in walking, not elsewhere classified: Secondary | ICD-10-CM | POA: Diagnosis present

## 2021-02-17 NOTE — Patient Instructions (Signed)
Access Code: [email or web address] URL: [email or web address] Date: 02/17/2021 Prepared by: Sherlyn Lees  Exercises Supine Quad Set - 1 x daily - 7 x weekly - 3 sets - 10 reps Supine Heel Slide with Strap - 1 x daily - 7 x weekly - 3 sets - 10 reps Supine Hip Adduction Isometric with Ball - 1 x daily - 7 x weekly - 3 sets - 10 reps Seated Isometric Hip Adduction with Ball - 1 x daily - 7 x weekly - 3 sets - 10 reps

## 2021-02-17 NOTE — Therapy (Signed)
[place], [place], [address] Phone: [phone]   Fax:  [phone]  Physical Therapy Evaluation  Patient Details  Name: Madison Reyes MRN: [id number] Date of Birth: 1961-04-01 Referring Provider (PT): Frederik Pear, MD   Encounter Date: 02/17/2021   PT End of Session - 02/17/21 1433     Visit Number 1    Number of Visits 12    Date for PT Re-Evaluation 03/31/21    Authorization Type UMR/U[employer] PPO, no auth 30 VL    Authorization - Visit Number 1    Authorization - Number of Visits 30    Progress Note Due on Visit 12    PT Start Time 1430    PT Stop Time 1515    PT Time Calculation (min) 45 min    Activity Tolerance Patient tolerated treatment well    Behavior During Therapy WFL for tasks assessed/performed             Past Medical History:  Diagnosis Date   Anxiety    Fibroids    h/o   H/O dysmenorrhea    H/O menorrhagia    High cholesterol    Menopausal symptoms    h/o   Migraine     Past Surgical History:  Procedure Laterality Date   ABDOMINAL HYSTERECTOMY     BACK SURGERY  2015   disc removal   LIPOMA EXCISION     MYOMECTOMY     ovarian fibroid     UTERINE FIBROID SURGERY      There were no vitals filed for this visit.    Subjective Assessment - 02/17/21 1436     Subjective left THR anterior approach on 02/13/21. No home health at this point and here for start of rehab    Currently in Pain? Yes    Pain Score 7     Pain Location Hip    Pain Orientation Left    Pain Descriptors / Indicators Aching    Pain Type Acute pain;Surgical pain    Pain Onset In the past 7 days    Pain Frequency Intermittent    Aggravating Factors  walking, repositionig in bed    Pain Relieving Factors movement, pain rx, stretching                [facility] PT Assessment - 02/17/21 0001       Assessment   Medical Diagnosis left THR; anterior approach    Referring Provider (PT) Frederik Pear, MD     Onset Date/Surgical Date 02/13/21    Next MD Visit 02/24/21      Precautions   Precautions Anterior Hip      Restrictions   Weight Bearing Restrictions Yes    LLE Weight Bearing Weight bearing as tolerated      Balance Screen   Has the patient fallen in the past 6 months No    Has the patient had a decrease in activity level because of a fear of falling?  No    Is the patient reluctant to leave their home because of a fear of falling?  No      Home Social worker Private residence    Living Arrangements Spouse/significant other    Available Help at Discharge Family    Type of [place] Access Level entry    [place] One level    [place] - 2 wheels;Cane - single point  Prior Function   Level of Independence Independent    Vocation Full time employment    Press photographer, administering tests    Leisure cook, clean, decorate      Observation/Other Assessments   Focus on Therapeutic Outcomes (FOTO)  15% function      ROM / Strength   AROM / PROM / Strength AROM;Strength      AROM   AROM Assessment Site Hip    Right/Left Hip Left    Left Hip Extension --   DNt due to precautions   Left Hip Flexion 70    Left Hip ABduction 20      Strength   Overall Strength Comments RLE 5/5; DNT LLE due to pain      Bed Mobility   Bed Mobility Supine to Sit;Sit to Supine    Supine to Sit Set up assist    Sit to Supine Set up assist      Transfers   Transfers Sit to Stand    Sit to Stand 6: Modified independent (Device/Increase time)      Ambulation/Gait   Ambulation/Gait Yes    Ambulation/Gait Assistance 6: Modified independent (Device/Increase time)    Ambulation Distance (Feet) 180 Feet    Assistive device Rolling walker    Gait Pattern Step-to pattern;Decreased stance time - left;Decreased step length - left    Ambulation Surface Level;Indoor    Gait velocity decreased    Gait Comments 2MWT                         Objective measurements completed on examination: See above findings.       [facility] Adult PT Treatment/Exercise - 02/17/21 0001       Exercises   Exercises Knee/Hip      Knee/Hip Exercises: Seated   Ball Squeeze 2x10      Knee/Hip Exercises: Supine   Quad Sets Strengthening;Left;2 sets;10 reps    Heel Slides AAROM;Left;2 sets;10 reps                    PT Education - 02/17/21 1514     Education Details education on anterior precautions, assessment findings, HEP initiation    Person(s) Educated Patient    Methods Explanation;Handout    Comprehension Verbalized understanding              PT Short Term Goals - 02/17/21 1518       PT SHORT TERM GOAL #1   Title Patient will be independent with HEP in order to improve functional outcomes.    Time 3    Period Weeks    Status New    Target Date 03/10/21      PT SHORT TERM GOAL #2   Title Patient will report at least 25% improvement in symptoms for improved quality of life.    Baseline 7/10 left hip/thigh pain    Time 3    Period Weeks    Status New    Target Date 03/10/21      PT SHORT TERM GOAL #3   Title Patient will demo left hip flexion to 90 degrees to improve independence and ease of mobility    Baseline 70 degrees left hip flexion, 20 degrees PROM abduction    Time 3    Period Weeks    Status New    Target Date 03/10/21      PT SHORT TERM GOAL #4   Title Demonstrate improved ambulation  tolerance as evidenced by 250 ft during 2MWT with least restrictive AD    Baseline 180 ft with RW and gait deviations    Time 3    Period Weeks    Status New    Target Date 03/10/21               PT Long Term Goals - 02/17/21 1520       PT LONG TERM GOAL #1   Title Patient will improve FOTO score to meet predicted outcomes    Baseline 15% function    Time 6    Period Weeks    Status New    Target Date 03/31/21      PT LONG TERM GOAL #2   Title Patient will demo 300 ft  distance during 2MWT with cane or without AD to improve community ambulation    Baseline 180 ft with RW    Time 6    Period Weeks    Status New    Target Date 03/31/21                    Plan - 02/17/21 1504     Clinical Impression Statement Patient is 60 yo lady s/p left THR via anterior approach and presents with new onset of difficulty in walking, reduced functional independence, decrease in activity tolerance, unsteadiness on feet, LE weakness, decrease in LLE ROM, and decline in functional mobility indicating need for PT services to facilitate return to PLOF and independent level of ambulation and mobility to return to work.    Personal Factors and Comorbidities Time since onset of injury/illness/exacerbation    Examination-Activity Limitations Bed Mobility;Bend;Carry;Lift;Stand;Stairs;Squat;Locomotion Level;Transfers    Examination-Participation Restrictions Church;Cleaning;Community Activity;Driving;Yard Work;Shop;Occupation    Stability/Clinical Decision Making Stable/Uncomplicated    Clinical Decision Making Low    Rehab Potential Excellent    PT Frequency 2x / week    PT Duration 6 weeks    PT Treatment/Interventions ADLs/Self Care Home Management;Cryotherapy;Electrical Stimulation;DME Instruction;Moist Heat;Gait training;Stair training;Functional mobility training;Therapeutic activities;Therapeutic exercise;Balance training;Patient/family education;Manual techniques;Passive range of motion    PT Next Visit Plan hip replacement rehab    PT Home Exercise Plan QS, heel slides, hip add (ball squeeze)    Consulted and Agree with Plan of Care Patient             Patient will benefit from skilled therapeutic intervention in order to improve the following deficits and impairments:  Abnormal gait, Decreased activity tolerance, Decreased mobility, Decreased endurance, Decreased range of motion, Decreased strength, Difficulty walking, Impaired perceived functional ability,  Pain  Visit Diagnosis: Difficulty in walking, not elsewhere classified  Muscle weakness (generalized)  Other abnormalities of gait and mobility     Problem List Patient Active Problem List   Diagnosis Date Noted   Multinodular goiter 01/26/2021   Atrophic vaginitis 04/03/2019   Dysuria 04/03/2019   Fatigue 04/03/2019   Female stress incontinence 04/03/2019   Glycosuria 04/03/2019   Hypercholesterolemia 04/03/2019   Increased frequency of urination 04/03/2019   Menopausal syndrome 04/03/2019   Chronic migraine without aura without status migrainosus, not intractable 06/15/2018   Hearing loss, left 06/15/2018   Migraine 04/07/2018   Referred otalgia of right ear 04/07/2018   Chronic idiopathic constipation 10/11/2017   Left-sided low back pain without sciatica 10/04/2014   Status post lumbar laminectomy 02/05/2014   Lumbar stenosis 01/22/2014   Anxiety 01/07/2014   Depression with anxiety 04/02/2013   3:22 PM, 02/17/21 M. Sherlyn Lees, PT, DPT Physical Therapist- [place]  Office Number: [place], [place], [address] Phone: [phone]   Fax:  [phone]  Name: Madison Reyes MRN: [id number] Date of Birth: 05-30-1961

## 2021-02-18 ENCOUNTER — Encounter (HOSPITAL_COMMUNITY): Payer: Self-pay

## 2021-02-18 ENCOUNTER — Ambulatory Visit (HOSPITAL_COMMUNITY): Payer: Commercial Managed Care - PPO

## 2021-02-18 DIAGNOSIS — R2689 Other abnormalities of gait and mobility: Secondary | ICD-10-CM

## 2021-02-18 DIAGNOSIS — M6281 Muscle weakness (generalized): Secondary | ICD-10-CM

## 2021-02-18 DIAGNOSIS — R262 Difficulty in walking, not elsewhere classified: Secondary | ICD-10-CM | POA: Diagnosis not present

## 2021-02-18 NOTE — Therapy (Signed)
[place], [place], [address] Phone: [phone]   Fax:  [phone]  Physical Therapy Treatment  Patient Details  Name: Madison Reyes MRN: [id number] Date of Birth: 06-22-1961 Referring Provider (PT): Frederik Pear, MD   Encounter Date: 02/18/2021   PT End of Session - 02/18/21 1652     Visit Number 2    Number of Visits 12    Date for PT Re-Evaluation 03/31/21    Authorization Type UMR/UHC PPO, no auth 30 VL    Authorization - Visit Number 2    Authorization - Number of Visits 30    Progress Note Due on Visit 12    PT Start Time 1645    PT Stop Time 1730    PT Time Calculation (min) 45 min    Activity Tolerance Patient tolerated treatment well    Behavior During Therapy WFL for tasks assessed/performed             Past Medical History:  Diagnosis Date   Anxiety    Fibroids    h/o   H/O dysmenorrhea    H/O menorrhagia    High cholesterol    Menopausal symptoms    h/o   Migraine     Past Surgical History:  Procedure Laterality Date   ABDOMINAL HYSTERECTOMY     BACK SURGERY  2015   disc removal   LIPOMA EXCISION     MYOMECTOMY     ovarian fibroid     UTERINE FIBROID SURGERY      There were no vitals filed for this visit.   Subjective Assessment - 02/18/21 1651     Subjective More soreness in left thigh today. I have not been icing down like I should    Currently in Pain? Yes    Pain Score 8     Pain Location Hip    Pain Orientation Left    Pain Descriptors / Indicators Aching    Pain Type Acute pain;Surgical pain                               [facility] Adult PT Treatment/Exercise - 02/18/21 0001       Knee/Hip Exercises: Standing   Heel Raises Both;2 sets;10 reps    Knee Flexion Strengthening;Left;2 sets;10 reps    Other Standing Knee Exercises sidestepping x 2 min    Other Standing Knee Exercises weight shifting on airex pad x 2 min   standing halos with 8  lbs x 2 min     Knee/Hip Exercises: Seated   Long Arc Quad Strengthening;Both;3 sets;10 reps    Ball Squeeze 3x10      Knee/Hip Exercises: Supine   Quad Sets Strengthening;Left;2 sets;10 reps    Heel Slides AAROM;Left;2 sets;10 reps    Other Supine Knee/Hip Exercises supine hip abd/add 2x10    Other Supine Knee/Hip Exercises glute set 2x10                      PT Short Term Goals - 02/17/21 1518       PT SHORT TERM GOAL #1   Title Patient will be independent with HEP in order to improve functional outcomes.    Time 3    Period Weeks    Status New    Target Date 03/10/21      PT SHORT TERM GOAL #2   Title Patient will report  at least 25% improvement in symptoms for improved quality of life.    Baseline 7/10 left hip/thigh pain    Time 3    Period Weeks    Status New    Target Date 03/10/21      PT SHORT TERM GOAL #3   Title Patient will demo left hip flexion to 90 degrees to improve independence and ease of mobility    Baseline 70 degrees left hip flexion, 20 degrees PROM abduction    Time 3    Period Weeks    Status New    Target Date 03/10/21      PT SHORT TERM GOAL #4   Title Demonstrate improved ambulation tolerance as evidenced by 250 ft during 2MWT with least restrictive AD    Baseline 180 ft with RW and gait deviations    Time 3    Period Weeks    Status New    Target Date 03/10/21               PT Long Term Goals - 02/17/21 1520       PT LONG TERM GOAL #1   Title Patient will improve FOTO score to meet predicted outcomes    Baseline 15% function    Time 6    Period Weeks    Status New    Target Date 03/31/21      PT LONG TERM GOAL #2   Title Patient will demo 300 ft distance during 2MWT with cane or without AD to improve community ambulation    Baseline 180 ft with RW    Time 6    Period Weeks    Status New    Target Date 03/31/21                   Plan - 02/18/21 1720     Clinical Impression Statement Pt notes  increase in muscle pain along anterior left thigh.  Advised to increase frequency of cold pack application.  Continued PT sessions indicated to improve LLE strength, function, and normalize gait pattern to decrease need for AD    Personal Factors and Comorbidities Time since onset of injury/illness/exacerbation    Examination-Activity Limitations Bed Mobility;Bend;Carry;Lift;Stand;Stairs;Squat;Locomotion Level;Transfers    Examination-Participation Restrictions Church;Cleaning;Community Activity;Driving;Yard Work;Shop;Occupation    Stability/Clinical Decision Making Stable/Uncomplicated    Rehab Potential Excellent    PT Frequency 2x / week    PT Duration 6 weeks    PT Treatment/Interventions ADLs/Self Care Home Management;Cryotherapy;Electrical Stimulation;DME Instruction;Moist Heat;Gait training;Stair training;Functional mobility training;Therapeutic activities;Therapeutic exercise;Balance training;Patient/family education;Manual techniques;Passive range of motion    PT Next Visit Plan hip replacement rehab    PT Home Exercise Plan QS, heel slides, hip add (ball squeeze)    Consulted and Agree with Plan of Care Patient             Patient will benefit from skilled therapeutic intervention in order to improve the following deficits and impairments:  Abnormal gait, Decreased activity tolerance, Decreased mobility, Decreased endurance, Decreased range of motion, Decreased strength, Difficulty walking, Impaired perceived functional ability, Pain  Visit Diagnosis: Difficulty in walking, not elsewhere classified  Muscle weakness (generalized)  Other abnormalities of gait and mobility     Problem List Patient Active Problem List   Diagnosis Date Noted   Multinodular goiter 01/26/2021   Atrophic vaginitis 04/03/2019   Dysuria 04/03/2019   Fatigue 04/03/2019   Female stress incontinence 04/03/2019   Glycosuria 04/03/2019   Hypercholesterolemia 04/03/2019   Increased frequency of  urination 04/03/2019   Menopausal syndrome 04/03/2019   Chronic migraine without aura without status migrainosus, not intractable 06/15/2018   Hearing loss, left 06/15/2018   Migraine 04/07/2018   Referred otalgia of right ear 04/07/2018   Chronic idiopathic constipation 10/11/2017   Left-sided low back pain without sciatica 10/04/2014   Status post lumbar laminectomy 02/05/2014   Lumbar stenosis 01/22/2014   Anxiety 01/07/2014   Depression with anxiety 04/02/2013   5:26 PM, 02/18/21 M. Sherlyn Lees, PT, DPT Physical Therapist- [place] Office Number: [phone]   [place] [address] [place], [place], [address] Phone: [phone]   Fax:  [phone]  Name: ROSHAUNDA STARKEY MRN: [id number] Date of Birth: 01-21-1961

## 2021-02-24 ENCOUNTER — Encounter (HOSPITAL_COMMUNITY): Payer: Self-pay

## 2021-02-24 ENCOUNTER — Other Ambulatory Visit: Payer: Self-pay

## 2021-02-24 ENCOUNTER — Ambulatory Visit (HOSPITAL_COMMUNITY): Payer: Commercial Managed Care - PPO | Attending: Orthopedic Surgery

## 2021-02-24 DIAGNOSIS — R2689 Other abnormalities of gait and mobility: Secondary | ICD-10-CM | POA: Diagnosis present

## 2021-02-24 DIAGNOSIS — R262 Difficulty in walking, not elsewhere classified: Secondary | ICD-10-CM | POA: Insufficient documentation

## 2021-02-24 DIAGNOSIS — M6281 Muscle weakness (generalized): Secondary | ICD-10-CM | POA: Diagnosis present

## 2021-02-24 NOTE — Therapy (Signed)
[place], [place], [address] Phone: [phone]   Fax:  [phone]  Physical Therapy Treatment  Patient Details  Name: Madison Reyes MRN: [id number] Date of Birth: Mar 20, 1961 Referring Provider (PT): Frederik Pear, MD   Encounter Date: 02/24/2021   PT End of Session - 02/24/21 1356     Visit Number 3    Number of Visits 12    Date for PT Re-Evaluation 03/31/21    Authorization Type UMR/UHC PPO, no auth 30 VL    Authorization - Visit Number 3    Authorization - Number of Visits 30    Progress Note Due on Visit 12    PT Start Time 8099    PT Stop Time 1430    PT Time Calculation (min) 35 min    Activity Tolerance Patient tolerated treatment well    Behavior During Therapy WFL for tasks assessed/performed             Past Medical History:  Diagnosis Date   Anxiety    Fibroids    h/o   H/O dysmenorrhea    H/O menorrhagia    High cholesterol    Menopausal symptoms    h/o   Migraine     Past Surgical History:  Procedure Laterality Date   ABDOMINAL HYSTERECTOMY     BACK SURGERY  2015   disc removal   LIPOMA EXCISION     MYOMECTOMY     ovarian fibroid     UTERINE FIBROID SURGERY      There were no vitals filed for this visit.   Subjective Assessment - 02/24/21 1358     Subjective Feeling better but still having pain in the hip/groin    Currently in Pain? Yes    Pain Score 6     Pain Location Hip    Pain Orientation Left    Pain Descriptors / Indicators Aching    Pain Type Acute pain;Surgical pain                [facility] PT Assessment - 02/24/21 0001       Assessment   Medical Diagnosis left THR; anterior approach    Referring Provider (PT) Frederik Pear, MD    Onset Date/Surgical Date 02/13/21    Next MD Visit 02/25/21      Precautions   Precautions Anterior Hip                           [facility] Adult PT Treatment/Exercise - 02/24/21 0001       Knee/Hip  Exercises: Standing   Heel Raises Both;2 sets;10 reps    Knee Flexion Strengthening;Left;2 sets;10 reps    Knee Flexion Limitations red t-band    Rocker Board 2 minutes   ant-post, lateral   Gait Training gait training with cane and supervision for verbal cues in sequence. Stair negotiation with cane and supervision for verbal cues    Other Standing Knee Exercises sidestepping x 2 min    Other Standing Knee Exercises static standing on airex pad, staggered stance, RLE elevated 3x15 sec eyes open/closed x 15 sec 5 rounds      Knee/Hip Exercises: Seated   Long Arc Quad Strengthening;Left;2 sets;10 reps    Long Arc Quad Limitations red t-band                      PT Short Term Goals - 02/17/21 1518  PT SHORT TERM GOAL #1   Title Patient will be independent with HEP in order to improve functional outcomes.    Time 3    Period Weeks    Status New    Target Date 03/10/21      PT SHORT TERM GOAL #2   Title Patient will report at least 25% improvement in symptoms for improved quality of life.    Baseline 7/10 left hip/thigh pain    Time 3    Period Weeks    Status New    Target Date 03/10/21      PT SHORT TERM GOAL #3   Title Patient will demo left hip flexion to 90 degrees to improve independence and ease of mobility    Baseline 70 degrees left hip flexion, 20 degrees PROM abduction    Time 3    Period Weeks    Status New    Target Date 03/10/21      PT SHORT TERM GOAL #4   Title Demonstrate improved ambulation tolerance as evidenced by 250 ft during 2MWT with least restrictive AD    Baseline 180 ft with RW and gait deviations    Time 3    Period Weeks    Status New    Target Date 03/10/21               PT Long Term Goals - 02/17/21 1520       PT LONG TERM GOAL #1   Title Patient will improve FOTO score to meet predicted outcomes    Baseline 15% function    Time 6    Period Weeks    Status New    Target Date 03/31/21      PT LONG TERM GOAL  #2   Title Patient will demo 300 ft distance during 2MWT with cane or without AD to improve community ambulation    Baseline 180 ft with RW    Time 6    Period Weeks    Status New    Target Date 03/31/21                   Plan - 02/24/21 1427     Clinical Impression Statement Pt demonstrating improved activity tolerance today with improve ambulation pattern and decrease in antalgia with use of single point cane. Continued sessions indicated to progress BLE strength and normalize gait pattern to decrease need for AD    Personal Factors and Comorbidities Time since onset of injury/illness/exacerbation    Examination-Activity Limitations Bed Mobility;Bend;Carry;Lift;Stand;Stairs;Squat;Locomotion Level;Transfers    Examination-Participation Restrictions Church;Cleaning;Community Activity;Driving;Yard Work;Shop;Occupation    Stability/Clinical Decision Making Stable/Uncomplicated    Rehab Potential Excellent    PT Frequency 2x / week    PT Duration 6 weeks    PT Treatment/Interventions ADLs/Self Care Home Management;Cryotherapy;Electrical Stimulation;DME Instruction;Moist Heat;Gait training;Stair training;Functional mobility training;Therapeutic activities;Therapeutic exercise;Balance training;Patient/family education;Manual techniques;Passive range of motion    PT Next Visit Plan hip replacement rehab    PT Home Exercise Plan QS, heel slides, hip add (ball squeeze); LAQ, standing h/s curl, sidestepping with red t-loop    Consulted and Agree with Plan of Care Patient             Patient will benefit from skilled therapeutic intervention in order to improve the following deficits and impairments:  Abnormal gait, Decreased activity tolerance, Decreased mobility, Decreased endurance, Decreased range of motion, Decreased strength, Difficulty walking, Impaired perceived functional ability, Pain  Visit Diagnosis: Difficulty in walking, not elsewhere classified  Other abnormalities  of gait and mobility  Muscle weakness (generalized)     Problem List Patient Active Problem List   Diagnosis Date Noted   Multinodular goiter 01/26/2021   Atrophic vaginitis 04/03/2019   Dysuria 04/03/2019   Fatigue 04/03/2019   Female stress incontinence 04/03/2019   Glycosuria 04/03/2019   Hypercholesterolemia 04/03/2019   Increased frequency of urination 04/03/2019   Menopausal syndrome 04/03/2019   Chronic migraine without aura without status migrainosus, not intractable 06/15/2018   Hearing loss, left 06/15/2018   Migraine 04/07/2018   Referred otalgia of right ear 04/07/2018   Chronic idiopathic constipation 10/11/2017   Left-sided low back pain without sciatica 10/04/2014   Status post lumbar laminectomy 02/05/2014   Lumbar stenosis 01/22/2014   Anxiety 01/07/2014   Depression with anxiety 04/02/2013   2:30 PM, 02/24/21 M. Sherlyn Lees, PT, DPT Physical Therapist- [place] Office Number: [phone]   [place] [address] [place], [place], [address] Phone: [phone]   Fax:  [phone]  Name: Madison Reyes MRN: [id number] Date of Birth: March 13, 1961

## 2021-02-26 ENCOUNTER — Ambulatory Visit (HOSPITAL_COMMUNITY): Payer: Commercial Managed Care - PPO

## 2021-02-26 ENCOUNTER — Other Ambulatory Visit: Payer: Self-pay

## 2021-02-26 ENCOUNTER — Encounter (HOSPITAL_COMMUNITY): Payer: Self-pay

## 2021-02-26 DIAGNOSIS — R262 Difficulty in walking, not elsewhere classified: Secondary | ICD-10-CM

## 2021-02-26 DIAGNOSIS — R2689 Other abnormalities of gait and mobility: Secondary | ICD-10-CM

## 2021-02-26 DIAGNOSIS — M6281 Muscle weakness (generalized): Secondary | ICD-10-CM

## 2021-02-26 NOTE — Therapy (Signed)
[place], [place], [address] Phone: [phone]   Fax:  [phone]  Physical Therapy Treatment  Patient Details  Name: Madison Reyes MRN: [id number] Date of Birth: 1961/05/01 Referring Provider (PT): Frederik Pear, MD   Encounter Date: 02/26/2021   PT End of Session - 02/26/21 1339     Visit Number 4    Number of Visits 12    Authorization Type UMR/[employer] PPO, no auth 30 VL    Authorization - Visit Number 4    Authorization - Number of Visits 30    Progress Note Due on Visit 12    PT Start Time 1331    PT Stop Time 1412    PT Time Calculation (min) 41 min    Activity Tolerance Patient tolerated treatment well    Behavior During Therapy WFL for tasks assessed/performed             Past Medical History:  Diagnosis Date   Anxiety    Fibroids    h/o   H/O dysmenorrhea    H/O menorrhagia    High cholesterol    Menopausal symptoms    h/o   Migraine     Past Surgical History:  Procedure Laterality Date   ABDOMINAL HYSTERECTOMY     BACK SURGERY  2015   disc removal   LIPOMA EXCISION     MYOMECTOMY     ovarian fibroid     UTERINE FIBROID SURGERY      There were no vitals filed for this visit.   Subjective Assessment - 02/26/21 1336     Subjective Pt late for apt today, stated husband had apt in [place] earlier.  Reports Lt hip is sore today.  Reports main difficulty washing feet in shower and painting toes, requires husband assistance.    Currently in Pain? Yes    Pain Score 6     Pain Location Hip    Pain Orientation Left    Pain Descriptors / Indicators Sore    Pain Type Surgical pain;Acute pain    Pain Onset 1 to 4 weeks ago    Pain Frequency Intermittent    Aggravating Factors  walking, repositioning in bed    Pain Relieving Factors movement, pain rx, stretching                [facility] PT Assessment - 02/26/21 0001       Assessment   Medical Diagnosis left THR; anterior  approach    Referring Provider (PT) Frederik Pear, MD    Onset Date/Surgical Date 02/13/21    Next MD Visit 03/26/21      Precautions   Precautions Anterior Hip                           [facility] Adult PT Treatment/Exercise - 02/26/21 0001       Ambulation/Gait   Ambulation Distance (Feet) 246 Feet    Assistive device Straight cane    Gait Pattern Step-through pattern    Ambulation Surface Level;Indoor    Gait velocity decreased    Gait Comments 2MWT      Knee/Hip Exercises: Standing   Heel Raises 2 sets;15 reps    Heel Raises Limitations incline slope    Hip Flexion 20 reps;Knee bent   toe tapping alternating 6in step height   Rocker Board 2 minutes    Rocker Board Limitations lateral and DF/PF    SLS  Lt 44", Rt 36"    SLS with Vectors 3x5" BLE 1 HHA    Gait Training 2MWT 22ft SPC    Other Standing Knee Exercises sidestep x 2 min without HHA    Other Standing Knee Exercises tandem stance x 30"      Knee/Hip Exercises: Seated   Sit to Sand 10 reps                      PT Short Term Goals - 02/17/21 1518       PT SHORT TERM GOAL #1   Title Patient will be independent with HEP in order to improve functional outcomes.    Time 3    Period Weeks    Status New    Target Date 03/10/21      PT SHORT TERM GOAL #2   Title Patient will report at least 25% improvement in symptoms for improved quality of life.    Baseline 7/10 left hip/thigh pain    Time 3    Period Weeks    Status New    Target Date 03/10/21      PT SHORT TERM GOAL #3   Title Patient will demo left hip flexion to 90 degrees to improve independence and ease of mobility    Baseline 70 degrees left hip flexion, 20 degrees PROM abduction    Time 3    Period Weeks    Status New    Target Date 03/10/21      PT SHORT TERM GOAL #4   Title Demonstrate improved ambulation tolerance as evidenced by 250 ft during 2MWT with least restrictive AD    Baseline 180 ft with RW and gait  deviations    Time 3    Period Weeks    Status New    Target Date 03/10/21               PT Long Term Goals - 02/17/21 1520       PT LONG TERM GOAL #1   Title Patient will improve FOTO score to meet predicted outcomes    Baseline 15% function    Time 6    Period Weeks    Status New    Target Date 03/31/21      PT LONG TERM GOAL #2   Title Patient will demo 300 ft distance during 2MWT with cane or without AD to improve community ambulation    Baseline 180 ft with RW    Time 6    Period Weeks    Status New    Target Date 03/31/21                   Plan - 02/26/21 1428     Clinical Impression Statement Pt progressing well towards goals.  Pt able to demonstrate good sequence with SPC and increased distance during 2MWT wiht LRAD, continues to have antalgic gait mechanics.  Session focus with hip strengthening and mobilty.  Added toe tapping for hip strengthening to assist with SLS activities and getting foot on side of shower for functional strengthening.  Added SLS and vector stance for hip stability.  Reviewed anterior hip precautions.    Personal Factors and Comorbidities Time since onset of injury/illness/exacerbation    Examination-Activity Limitations Bed Mobility;Bend;Carry;Lift;Stand;Stairs;Squat;Locomotion Level;Transfers    Examination-Participation Restrictions Church;Cleaning;Community Activity;Driving;Yard Work;Shop;Occupation    Stability/Clinical Decision Making Stable/Uncomplicated    Clinical Decision Making Low    Rehab Potential Excellent    PT Frequency 2x /  week    PT Duration 6 weeks    PT Treatment/Interventions ADLs/Self Care Home Management;Cryotherapy;Electrical Stimulation;DME Instruction;Moist Heat;Gait training;Stair training;Functional mobility training;Therapeutic activities;Therapeutic exercise;Balance training;Patient/family education;Manual techniques;Passive range of motion    PT Next Visit Plan hip replacement rehab    PT Home  Exercise Plan QS, heel slides, hip add (ball squeeze); LAQ, standing h/s curl, sidestepping with red t-loop    Consulted and Agree with Plan of Care Patient             Patient will benefit from skilled therapeutic intervention in order to improve the following deficits and impairments:  Abnormal gait, Decreased activity tolerance, Decreased mobility, Decreased endurance, Decreased range of motion, Decreased strength, Difficulty walking, Impaired perceived functional ability, Pain  Visit Diagnosis: Difficulty in walking, not elsewhere classified  Muscle weakness (generalized)  Other abnormalities of gait and mobility     Problem List Patient Active Problem List   Diagnosis Date Noted   Multinodular goiter 01/26/2021   Atrophic vaginitis 04/03/2019   Dysuria 04/03/2019   Fatigue 04/03/2019   Female stress incontinence 04/03/2019   Glycosuria 04/03/2019   Hypercholesterolemia 04/03/2019   Increased frequency of urination 04/03/2019   Menopausal syndrome 04/03/2019   Chronic migraine without aura without status migrainosus, not intractable 06/15/2018   Hearing loss, left 06/15/2018   Migraine 04/07/2018   Referred otalgia of right ear 04/07/2018   Chronic idiopathic constipation 10/11/2017   Left-sided low back pain without sciatica 10/04/2014   Status post lumbar laminectomy 02/05/2014   Lumbar stenosis 01/22/2014   Anxiety 01/07/2014   Depression with anxiety 04/02/2013   Ihor Austin, LPTA/CLT; CBIS [phone]  Aldona Lento 02/26/2021, 2:32 PM  [place], [place], [address] Phone: [phone]   Fax:  [phone]  Name: Madison Reyes MRN: [id number] Date of Birth: 1960/11/21

## 2021-03-03 ENCOUNTER — Ambulatory Visit (HOSPITAL_COMMUNITY): Payer: Commercial Managed Care - PPO

## 2021-03-03 ENCOUNTER — Other Ambulatory Visit: Payer: Self-pay

## 2021-03-03 ENCOUNTER — Encounter (HOSPITAL_COMMUNITY): Payer: Self-pay

## 2021-03-03 DIAGNOSIS — R2689 Other abnormalities of gait and mobility: Secondary | ICD-10-CM

## 2021-03-03 DIAGNOSIS — M6281 Muscle weakness (generalized): Secondary | ICD-10-CM

## 2021-03-03 DIAGNOSIS — R262 Difficulty in walking, not elsewhere classified: Secondary | ICD-10-CM

## 2021-03-03 NOTE — Therapy (Signed)
[place], [place], [address] Phone: [phone]   Fax:  [phone]  Physical Therapy Treatment  Patient Details  Name: Madison Reyes MRN: [id number] Date of Birth: 12-19-1960 Referring Provider (PT): Frederik Pear, MD   Encounter Date: 03/03/2021   PT End of Session - 03/03/21 1304     Visit Number 5    Number of Visits 12    Authorization Type UMR/UHC PPO, no auth 30 VL    Authorization - Visit Number 5    Authorization - Number of Visits 30    Progress Note Due on Visit 12    PT Start Time 1300    PT Stop Time 1345    PT Time Calculation (min) 45 min    Activity Tolerance Patient tolerated treatment well    Behavior During Therapy WFL for tasks assessed/performed             Past Medical History:  Diagnosis Date   Anxiety    Fibroids    h/o   H/O dysmenorrhea    H/O menorrhagia    High cholesterol    Menopausal symptoms    h/o   Migraine     Past Surgical History:  Procedure Laterality Date   ABDOMINAL HYSTERECTOMY     BACK SURGERY  2015   disc removal   LIPOMA EXCISION     MYOMECTOMY     ovarian fibroid     UTERINE FIBROID SURGERY      There were no vitals filed for this visit.   Subjective Assessment - 03/03/21 1303     Subjective Feeling better, not much pain    Pain Onset 1 to 4 weeks ago                               [facility] Adult PT Treatment/Exercise - 03/03/21 0001       Knee/Hip Exercises: Standing   Knee Flexion Strengthening;Both;3 sets;10 reps    Knee Flexion Limitations 3#    Hip Flexion Stengthening;Both    Hip Flexion Limitations stair taps 6" box x 2 min 3#    Lateral Step Up Left;2 sets;10 reps;Step Height: 6"    Lateral Step Up Limitations BUE support    Forward Step Up Left;2 sets;10 reps;Step Height: 6"    Forward Step Up Limitations BUE support    Gait Training retro-forward, sidestepping x 2 min for dynamic warm-up. Resisted  retro-forward x 3 plates 2 x 2 min      Knee/Hip Exercises: Seated   Long Arc Quad Strengthening;Both;3 sets;10 reps    Long Arc Quad Weight 3 lbs.      Knee/Hip Exercises: Supine   Short Arc Quad Sets Strengthening;Both;3 sets;10 reps    Short Arc Quad Sets Limitations 3#    Heel Slides AAROM;Left;3 sets;10 reps    Heel Slides Limitations with green ball for added hip flexion    Other Supine Knee/Hip Exercises supine hip abd/add 3x10                      PT Short Term Goals - 02/17/21 1518       PT SHORT TERM GOAL #1   Title Patient will be independent with HEP in order to improve functional outcomes.    Time 3    Period Weeks    Status New    Target Date 03/10/21  PT SHORT TERM GOAL #2   Title Patient will report at least 25% improvement in symptoms for improved quality of life.    Baseline 7/10 left hip/thigh pain    Time 3    Period Weeks    Status New    Target Date 03/10/21      PT SHORT TERM GOAL #3   Title Patient will demo left hip flexion to 90 degrees to improve independence and ease of mobility    Baseline 70 degrees left hip flexion, 20 degrees PROM abduction    Time 3    Period Weeks    Status New    Target Date 03/10/21      PT SHORT TERM GOAL #4   Title Demonstrate improved ambulation tolerance as evidenced by 250 ft during 2MWT with least restrictive AD    Baseline 180 ft with RW and gait deviations    Time 3    Period Weeks    Status New    Target Date 03/10/21               PT Long Term Goals - 02/17/21 1520       PT LONG TERM GOAL #1   Title Patient will improve FOTO score to meet predicted outcomes    Baseline 15% function    Time 6    Period Weeks    Status New    Target Date 03/31/21      PT LONG TERM GOAL #2   Title Patient will demo 300 ft distance during 2MWT with cane or without AD to improve community ambulation    Baseline 180 ft with RW    Time 6    Period Weeks    Status New    Target Date 03/31/21                    Plan - 03/03/21 1339     Clinical Impression Statement Progressing very well with POC details and demonstrating increased srtength and activity tolerance as evidenced by decreased need for rest periods and able to increase resistance/repetition for activities without adverse effects.  Decrease in antalgia with ambulation and nearing a normalized pattern on level surfaces.  Continued discomfort with isolated left hip abduction and end range of flexion to 102 degrees.  Continued sessions indicated to progress POC to ambulation over various surfaces and increased velocity without AD    Personal Factors and Comorbidities Time since onset of injury/illness/exacerbation    Examination-Activity Limitations Bed Mobility;Bend;Carry;Lift;Stand;Stairs;Squat;Locomotion Level;Transfers    Examination-Participation Restrictions Church;Cleaning;Community Activity;Driving;Yard Work;Shop;Occupation    Stability/Clinical Decision Making Stable/Uncomplicated    Rehab Potential Excellent    PT Frequency 2x / week    PT Duration 6 weeks    PT Treatment/Interventions ADLs/Self Care Home Management;Cryotherapy;Electrical Stimulation;DME Instruction;Moist Heat;Gait training;Stair training;Functional mobility training;Therapeutic activities;Therapeutic exercise;Balance training;Patient/family education;Manual techniques;Passive range of motion    PT Next Visit Plan hip replacement rehab    PT Home Exercise Plan QS, heel slides, hip add (ball squeeze); LAQ, standing h/s curl, sidestepping with red t-loop    Consulted and Agree with Plan of Care Patient             Patient will benefit from skilled therapeutic intervention in order to improve the following deficits and impairments:  Abnormal gait, Decreased activity tolerance, Decreased mobility, Decreased endurance, Decreased range of motion, Decreased strength, Difficulty walking, Impaired perceived functional ability, Pain  Visit  Diagnosis: Difficulty in walking, not elsewhere classified  Muscle weakness (generalized)  Other abnormalities of gait and mobility     Problem List Patient Active Problem List   Diagnosis Date Noted   Multinodular goiter 01/26/2021   Atrophic vaginitis 04/03/2019   Dysuria 04/03/2019   Fatigue 04/03/2019   Female stress incontinence 04/03/2019   Glycosuria 04/03/2019   Hypercholesterolemia 04/03/2019   Increased frequency of urination 04/03/2019   Menopausal syndrome 04/03/2019   Chronic migraine without aura without status migrainosus, not intractable 06/15/2018   Hearing loss, left 06/15/2018   Migraine 04/07/2018   Referred otalgia of right ear 04/07/2018   Chronic idiopathic constipation 10/11/2017   Left-sided low back pain without sciatica 10/04/2014   Status post lumbar laminectomy 02/05/2014   Lumbar stenosis 01/22/2014   Anxiety 01/07/2014   Depression with anxiety 04/02/2013   1:43 PM, 03/03/21 M. Sherlyn Lees, PT, DPT Physical Therapist- [place] Office Number: [phone]   [place] [address] [place], [place], [address] Phone: [phone]   Fax:  [phone]  Name: Madison Reyes MRN: [id number] Date of Birth: 26-Aug-1960

## 2021-03-05 ENCOUNTER — Encounter (HOSPITAL_COMMUNITY): Payer: Self-pay

## 2021-03-05 ENCOUNTER — Other Ambulatory Visit: Payer: Self-pay

## 2021-03-05 ENCOUNTER — Ambulatory Visit (HOSPITAL_COMMUNITY): Payer: Commercial Managed Care - PPO

## 2021-03-05 DIAGNOSIS — R262 Difficulty in walking, not elsewhere classified: Secondary | ICD-10-CM | POA: Diagnosis not present

## 2021-03-05 DIAGNOSIS — R2689 Other abnormalities of gait and mobility: Secondary | ICD-10-CM

## 2021-03-05 DIAGNOSIS — M6281 Muscle weakness (generalized): Secondary | ICD-10-CM

## 2021-03-05 NOTE — Therapy (Signed)
[place], [place], [address] Phone: [phone]   Fax:  [phone]  Physical Therapy Treatment  Patient Details  Name: Madison Reyes MRN: [id number] Date of Birth: Oct 01, 1960 Referring Provider (PT): Frederik Pear, MD   Encounter Date: 03/05/2021   PT End of Session - 03/05/21 1058     Visit Number 6    Number of Visits 12    Date for PT Re-Evaluation 03/31/21    Authorization Type UMR/UHC PPO, no auth 30 VL    Authorization - Visit Number 6    Authorization - Number of Visits 30    Progress Note Due on Visit 12    PT Start Time 1048    PT Stop Time 1128    PT Time Calculation (min) 40 min    Activity Tolerance Patient tolerated treatment well    Behavior During Therapy WFL for tasks assessed/performed             Past Medical History:  Diagnosis Date   Anxiety    Fibroids    h/o   H/O dysmenorrhea    H/O menorrhagia    High cholesterol    Menopausal symptoms    h/o   Migraine     Past Surgical History:  Procedure Laterality Date   ABDOMINAL HYSTERECTOMY     BACK SURGERY  2015   disc removal   LIPOMA EXCISION     MYOMECTOMY     ovarian fibroid     UTERINE FIBROID SURGERY      There were no vitals filed for this visit.   Subjective Assessment - 03/05/21 1054     Subjective Pt stated she has achey pain down lateral part of leg ending at knee.    Currently in Pain? Yes    Pain Score 6     Pain Location Leg    Pain Orientation Left;Proximal;Lateral    Pain Descriptors / Indicators Aching    Pain Type Surgical pain    Pain Onset 1 to 4 weeks ago    Pain Frequency Constant    Aggravating Factors  walking, repositioning in bed    Pain Relieving Factors movement, pain rx, stretching    Effect of Pain on Daily Activities limits                               [facility] Adult PT Treatment/Exercise - 03/05/21 0001       Knee/Hip Exercises: Machines for Strengthening    Other Machine Body Craft forward, retro and sidestep 3Pl 5x each direction      Knee/Hip Exercises: Standing   Hip Flexion 2 sets;15 reps    Hip Flexion Limitations alternating toe tapping on 6in box with 4# BLE    Hip Abduction Both;15 reps    Abduction Limitations 4    Hip Extension Both;15 reps    Extension Limitations 4    Lateral Step Up Left;15 reps;Hand Hold: 0;Hand Hold: 1;Step Height: 6"    Forward Step Up Left;15 reps;Hand Hold: 0;Hand Hold: 1    Step Down Left;15 reps;Hand Hold: 1;Hand Hold: 0;Step Height: 6"    SLS with Vectors 5x5" BLE 1 HHA      Knee/Hip Exercises: Seated   Long Arc Quad Strengthening;Both;2 sets;15 reps    Long Arc Quad Weight 4 lbs.    Sit to Sand 2 sets;10 reps;without UE support  PT Short Term Goals - 02/17/21 1518       PT SHORT TERM GOAL #1   Title Patient will be independent with HEP in order to improve functional outcomes.    Time 3    Period Weeks    Status New    Target Date 03/10/21      PT SHORT TERM GOAL #2   Title Patient will report at least 25% improvement in symptoms for improved quality of life.    Baseline 7/10 left hip/thigh pain    Time 3    Period Weeks    Status New    Target Date 03/10/21      PT SHORT TERM GOAL #3   Title Patient will demo left hip flexion to 90 degrees to improve independence and ease of mobility    Baseline 70 degrees left hip flexion, 20 degrees PROM abduction    Time 3    Period Weeks    Status New    Target Date 03/10/21      PT SHORT TERM GOAL #4   Title Demonstrate improved ambulation tolerance as evidenced by 250 ft during 2MWT with least restrictive AD    Baseline 180 ft with RW and gait deviations    Time 3    Period Weeks    Status New    Target Date 03/10/21               PT Long Term Goals - 02/17/21 1520       PT LONG TERM GOAL #1   Title Patient will improve FOTO score to meet predicted outcomes    Baseline 15% function    Time  6    Period Weeks    Status New    Target Date 03/31/21      PT LONG TERM GOAL #2   Title Patient will demo 300 ft distance during 2MWT with cane or without AD to improve community ambulation    Baseline 180 ft with RW    Time 6    Period Weeks    Status New    Target Date 03/31/21                   Plan - 03/05/21 1139     Clinical Impression Statement Pt progressing well towards POC.  Able to increased resistance and repetition wiht activities with good tolerance.  Began step down training for eccentric quad strengthening.  Reports of pain reduced at EOS.    Personal Factors and Comorbidities Time since onset of injury/illness/exacerbation    Examination-Activity Limitations Bed Mobility;Bend;Carry;Lift;Stand;Stairs;Squat;Locomotion Level;Transfers    Examination-Participation Restrictions Church;Cleaning;Community Activity;Driving;Yard Work;Shop;Occupation    Stability/Clinical Decision Making Stable/Uncomplicated    Clinical Decision Making Low    Rehab Potential Excellent    PT Frequency 2x / week    PT Duration 6 weeks    PT Treatment/Interventions ADLs/Self Care Home Management;Cryotherapy;Electrical Stimulation;DME Instruction;Moist Heat;Gait training;Stair training;Functional mobility training;Therapeutic activities;Therapeutic exercise;Balance training;Patient/family education;Manual techniques;Passive range of motion    PT Next Visit Plan hip replacement rehab    PT Home Exercise Plan QS, heel slides, hip add (ball squeeze); LAQ, standing h/s curl, sidestepping with red t-loop; 7/14: vector stance    Consulted and Agree with Plan of Care Patient             Patient will benefit from skilled therapeutic intervention in order to improve the following deficits and impairments:  Abnormal gait, Decreased activity tolerance, Decreased mobility, Decreased endurance, Decreased range of  motion, Decreased strength, Difficulty walking, Impaired perceived functional  ability, Pain  Visit Diagnosis: Difficulty in walking, not elsewhere classified  Muscle weakness (generalized)  Other abnormalities of gait and mobility     Problem List Patient Active Problem List   Diagnosis Date Noted   Multinodular goiter 01/26/2021   Atrophic vaginitis 04/03/2019   Dysuria 04/03/2019   Fatigue 04/03/2019   Female stress incontinence 04/03/2019   Glycosuria 04/03/2019   Hypercholesterolemia 04/03/2019   Increased frequency of urination 04/03/2019   Menopausal syndrome 04/03/2019   Chronic migraine without aura without status migrainosus, not intractable 06/15/2018   Hearing loss, left 06/15/2018   Migraine 04/07/2018   Referred otalgia of right ear 04/07/2018   Chronic idiopathic constipation 10/11/2017   Left-sided low back pain without sciatica 10/04/2014   Status post lumbar laminectomy 02/05/2014   Lumbar stenosis 01/22/2014   Anxiety 01/07/2014   Depression with anxiety 04/02/2013   Ihor Austin, LPTA/CLT; CBIS [phone]  Aldona Lento 03/05/2021, 12:39 PM  [place] [address] [place], [place], [address] Phone: [phone]   Fax:  [phone]  Name: Madison Reyes MRN: [id number] Date of Birth: [birth year]

## 2021-03-05 NOTE — Patient Instructions (Signed)
HIP: Flexion / KNEE: Extension, Heel Strike - Standing        Reach leg out to take step, knee straight. Bring out to side then extend back hold for 10" 5 reps per set, 2 sets per day, 10" holds.  Copyright  VHI. All rights reserved.

## 2021-03-10 ENCOUNTER — Ambulatory Visit (HOSPITAL_COMMUNITY): Payer: Commercial Managed Care - PPO

## 2021-03-10 ENCOUNTER — Other Ambulatory Visit: Payer: Self-pay

## 2021-03-10 DIAGNOSIS — R262 Difficulty in walking, not elsewhere classified: Secondary | ICD-10-CM | POA: Diagnosis not present

## 2021-03-10 DIAGNOSIS — R2689 Other abnormalities of gait and mobility: Secondary | ICD-10-CM

## 2021-03-10 DIAGNOSIS — M6281 Muscle weakness (generalized): Secondary | ICD-10-CM

## 2021-03-10 NOTE — Therapy (Signed)
[place], [place], [address] Phone: [phone]   Fax:  [phone]  Physical Therapy Treatment  Patient Details  Name: Madison Reyes MRN: [id number] Date of Birth: Aug 30, 1960 Referring Provider (PT): Frederik Pear, MD   Encounter Date: 03/10/2021   PT End of Session - 03/10/21 1315     Visit Number 7    Number of Visits 12    Date for PT Re-Evaluation 03/31/21    Authorization Type UMR/[employer] PPO, no auth 30 VL    Authorization - Visit Number 7    Authorization - Number of Visits 30    Progress Note Due on Visit 12    PT Start Time 1300    PT Stop Time 1345    PT Time Calculation (min) 45 min    Activity Tolerance Patient tolerated treatment well    Behavior During Therapy WFL for tasks assessed/performed             Past Medical History:  Diagnosis Date   Anxiety    Fibroids    h/o   H/O dysmenorrhea    H/O menorrhagia    High cholesterol    Menopausal symptoms    h/o   Migraine     Past Surgical History:  Procedure Laterality Date   ABDOMINAL HYSTERECTOMY     BACK SURGERY  2015   disc removal   LIPOMA EXCISION     MYOMECTOMY     ovarian fibroid     UTERINE FIBROID SURGERY      There were no vitals filed for this visit.   Subjective Assessment - 03/10/21 1307     Subjective "My left hip and buttocks is sore/tight. I think its from vacuuming/mopping"    Pain Onset 1 to 4 weeks ago                [facility] PT Assessment - 03/10/21 0001       Assessment   Medical Diagnosis left THR; anterior approach    Referring Provider (PT) Frederik Pear, MD    Onset Date/Surgical Date 02/13/21    Next MD Visit 03/26/21      Precautions   Precautions Anterior Hip                           [facility] Adult PT Treatment/Exercise - 03/10/21 0001       Knee/Hip Exercises: Standing   Other Standing Knee Exercises sidestep x 2 min without HHA      Knee/Hip Exercises: Supine    Short Arc Quad Sets Strengthening;Both;3 sets;10 reps    Short Arc Quad Sets Limitations 6#, 3#    Hip Adduction Isometric Strengthening;Both;3 sets;10 reps      Knee/Hip Exercises: Prone   Other Prone Exercises prone heel squeeze 3x10, prone knee extension 3x10                      PT Short Term Goals - 02/17/21 1518       PT SHORT TERM GOAL #1   Title Patient will be independent with HEP in order to improve functional outcomes.    Time 3    Period Weeks    Status New    Target Date 03/10/21      PT SHORT TERM GOAL #2   Title Patient will report at least 25% improvement in symptoms for improved quality of life.    Baseline 7/10  left hip/thigh pain    Time 3    Period Weeks    Status New    Target Date 03/10/21      PT SHORT TERM GOAL #3   Title Patient will demo left hip flexion to 90 degrees to improve independence and ease of mobility    Baseline 70 degrees left hip flexion, 20 degrees PROM abduction    Time 3    Period Weeks    Status New    Target Date 03/10/21      PT SHORT TERM GOAL #4   Title Demonstrate improved ambulation tolerance as evidenced by 250 ft during 2MWT with least restrictive AD    Baseline 180 ft with RW and gait deviations    Time 3    Period Weeks    Status New    Target Date 03/10/21               PT Long Term Goals - 02/17/21 1520       PT LONG TERM GOAL #1   Title Patient will improve FOTO score to meet predicted outcomes    Baseline 15% function    Time 6    Period Weeks    Status New    Target Date 03/31/21      PT LONG TERM GOAL #2   Title Patient will demo 300 ft distance during 2MWT with cane or without AD to improve community ambulation    Baseline 180 ft with RW    Time 6    Period Weeks    Status New    Target Date 03/31/21                   Plan - 03/10/21 1346     Clinical Impression Statement Improving strength and activity tolerance with decrease in antalgic pattern appreciated. Pt  notes some discomfort in her left piriformis area which she has had issues with before. Some improvement with prone position activities. Continued POC indicated to improve strength, normalize gait pattern, decrease use of AD    Personal Factors and Comorbidities Time since onset of injury/illness/exacerbation    Examination-Activity Limitations Bed Mobility;Bend;Carry;Lift;Stand;Stairs;Squat;Locomotion Level;Transfers    Examination-Participation Restrictions Church;Cleaning;Community Activity;Driving;Yard Work;Shop;Occupation    Stability/Clinical Decision Making Stable/Uncomplicated    Rehab Potential Excellent    PT Frequency 2x / week    PT Duration 6 weeks    PT Treatment/Interventions ADLs/Self Care Home Management;Cryotherapy;Electrical Stimulation;DME Instruction;Moist Heat;Gait training;Stair training;Functional mobility training;Therapeutic activities;Therapeutic exercise;Balance training;Patient/family education;Manual techniques;Passive range of motion    PT Next Visit Plan hip replacement rehab    PT Home Exercise Plan QS, heel slides, hip add (ball squeeze); LAQ, standing h/s curl, sidestepping with red t-loop; 7/14: vector stance    Consulted and Agree with Plan of Care Patient             Patient will benefit from skilled therapeutic intervention in order to improve the following deficits and impairments:  Abnormal gait, Decreased activity tolerance, Decreased mobility, Decreased endurance, Decreased range of motion, Decreased strength, Difficulty walking, Impaired perceived functional ability, Pain  Visit Diagnosis: Difficulty in walking, not elsewhere classified  Muscle weakness (generalized)  Other abnormalities of gait and mobility     Problem List Patient Active Problem List   Diagnosis Date Noted   Multinodular goiter 01/26/2021   Atrophic vaginitis 04/03/2019   Dysuria 04/03/2019   Fatigue 04/03/2019   Female stress incontinence 04/03/2019   Glycosuria  04/03/2019   Hypercholesterolemia 04/03/2019  Increased frequency of urination 04/03/2019   Menopausal syndrome 04/03/2019   Chronic migraine without aura without status migrainosus, not intractable 06/15/2018   Hearing loss, left 06/15/2018   Migraine 04/07/2018   Referred otalgia of right ear 04/07/2018   Chronic idiopathic constipation 10/11/2017   Left-sided low back pain without sciatica 10/04/2014   Status post lumbar laminectomy 02/05/2014   Lumbar stenosis 01/22/2014   Anxiety 01/07/2014   Depression with anxiety 04/02/2013   1:48 PM, 03/10/21 M. Sherlyn Lees, PT, DPT Physical Therapist- [place] Office Number: [phone]   [place] [address] [place], [place], [address] Phone: [phone]   Fax:  [phone]  Name: LAWRENCIA MAUNEY MRN: [id number] Date of Birth: 08-02-1961

## 2021-03-12 ENCOUNTER — Ambulatory Visit (HOSPITAL_COMMUNITY): Payer: Commercial Managed Care - PPO

## 2021-03-12 ENCOUNTER — Other Ambulatory Visit: Payer: Self-pay

## 2021-03-12 ENCOUNTER — Encounter (HOSPITAL_COMMUNITY): Payer: Self-pay

## 2021-03-12 DIAGNOSIS — R262 Difficulty in walking, not elsewhere classified: Secondary | ICD-10-CM

## 2021-03-12 DIAGNOSIS — R2689 Other abnormalities of gait and mobility: Secondary | ICD-10-CM

## 2021-03-12 DIAGNOSIS — M6281 Muscle weakness (generalized): Secondary | ICD-10-CM

## 2021-03-12 NOTE — Therapy (Signed)
[place], [place], [address] Phone: [phone]   Fax:  [phone]  Physical Therapy Treatment  Patient Details  Name: Madison Reyes MRN: [id number] Date of Birth: 03/09/1961 Referring Provider (PT): Frederik Pear, MD   Encounter Date: 03/12/2021   PT End of Session - 03/12/21 1326     Visit Number 8    Number of Visits 12    Date for PT Re-Evaluation 03/31/21    Authorization Type UMR/UHC PPO, no auth 30 VL    Authorization - Visit Number 8    Authorization - Number of Visits 30    Progress Note Due on Visit 12    PT Start Time 1318    PT Stop Time 1358    PT Time Calculation (min) 40 min    Activity Tolerance Patient tolerated treatment well    Behavior During Therapy WFL for tasks assessed/performed             Past Medical History:  Diagnosis Date   Anxiety    Fibroids    h/o   H/O dysmenorrhea    H/O menorrhagia    High cholesterol    Menopausal symptoms    h/o   Migraine     Past Surgical History:  Procedure Laterality Date   ABDOMINAL HYSTERECTOMY     BACK SURGERY  2015   disc removal   LIPOMA EXCISION     MYOMECTOMY     ovarian fibroid     UTERINE FIBROID SURGERY      There were no vitals filed for this visit.   Subjective Assessment - 03/12/21 1321     Subjective Pt stated her sciatic nerve is bothering her more today on Lt LE, stated she has had issues previously.  Leg and back feel sore and stiff today.  Called trying to get MD apt.    Currently in Pain? Yes    Pain Score 6     Pain Location Leg    Pain Orientation Left;Proximal;Posterior    Pain Descriptors / Indicators Aching;Sore;Tightness    Pain Type Surgical pain    Pain Onset 1 to 4 weeks ago    Pain Frequency Constant    Aggravating Factors  walking, repositioning in bed    Pain Relieving Factors movement, pain rx, stretching    Effect of Pain on Daily Activities limits                                [facility] Adult PT Treatment/Exercise - 03/12/21 0001       Knee/Hip Exercises: Stretches   Piriformis Stretch Left;3 reps;30 seconds    Piriformis Stretch Limitations supine 90 degree      Knee/Hip Exercises: Machines for Strengthening   Total Gym Leg Press 3x 10 3Pl then 4Pl      Knee/Hip Exercises: Standing   Gait Training Gait training no ADx 245ft      Knee/Hip Exercises: Sidelying   Hip ABduction 10 reps    Clams ; pain wiht movement      Knee/Hip Exercises: Prone   Hip Extension 3 sets;10 reps    Other Prone Exercises prone heel squeeze 20x 5"                      PT Short Term Goals - 02/17/21 1518       PT SHORT TERM GOAL #1  Title Patient will be independent with HEP in order to improve functional outcomes.    Time 3    Period Weeks    Status New    Target Date 03/10/21      PT SHORT TERM GOAL #2   Title Patient will report at least 25% improvement in symptoms for improved quality of life.    Baseline 7/10 left hip/thigh pain    Time 3    Period Weeks    Status New    Target Date 03/10/21      PT SHORT TERM GOAL #3   Title Patient will demo left hip flexion to 90 degrees to improve independence and ease of mobility    Baseline 70 degrees left hip flexion, 20 degrees PROM abduction    Time 3    Period Weeks    Status New    Target Date 03/10/21      PT SHORT TERM GOAL #4   Title Demonstrate improved ambulation tolerance as evidenced by 250 ft during 2MWT with least restrictive AD    Baseline 180 ft with RW and gait deviations    Time 3    Period Weeks    Status New    Target Date 03/10/21               PT Long Term Goals - 02/17/21 1520       PT LONG TERM GOAL #1   Title Patient will improve FOTO score to meet predicted outcomes    Baseline 15% function    Time 6    Period Weeks    Status New    Target Date 03/31/21      PT LONG TERM GOAL #2   Title Patient will demo 300 ft distance  during 2MWT with cane or without AD to improve community ambulation    Baseline 180 ft with RW    Time 6    Period Weeks    Status New    Target Date 03/31/21                   Plan - 03/12/21 1341     Clinical Impression Statement Pt limited with discomfort in Lt piriformis with increased pain with ER based exercises.  Session focus on hip musculature strengthening and mobility for pain control.  Pt educated on hip anatomy with pictures and educated on function of muscualture, shown stretch with reports of relief following.  Pt presents wiht minimal antaglic gait mechanics without AD.  Added gluteal strengthening exercises to HEP with printout given and verbalized understanding.    Personal Factors and Comorbidities Time since onset of injury/illness/exacerbation    Examination-Activity Limitations Bed Mobility;Bend;Carry;Lift;Stand;Stairs;Squat;Locomotion Level;Transfers    Examination-Participation Restrictions Church;Cleaning;Community Activity;Driving;Yard Work;Shop;Occupation    Stability/Clinical Decision Making Stable/Uncomplicated    Clinical Decision Making Low    Rehab Potential Excellent    PT Frequency 2x / week    PT Duration 6 weeks    PT Treatment/Interventions ADLs/Self Care Home Management;Cryotherapy;Electrical Stimulation;DME Instruction;Moist Heat;Gait training;Stair training;Functional mobility training;Therapeutic activities;Therapeutic exercise;Balance training;Patient/family education;Manual techniques;Passive range of motion    PT Next Visit Plan hip replacement rehab    PT Home Exercise Plan QS, heel slides, hip add (ball squeeze); LAQ, standing h/s curl, sidestepping with red t-loop; 7/14: vector stance; 03/12/21: sidelying clam and abd    Consulted and Agree with Plan of Care Patient             Patient will benefit from skilled therapeutic intervention  in order to improve the following deficits and impairments:  Abnormal gait, Decreased activity  tolerance, Decreased mobility, Decreased endurance, Decreased range of motion, Decreased strength, Difficulty walking, Impaired perceived functional ability, Pain  Visit Diagnosis: Difficulty in walking, not elsewhere classified  Muscle weakness (generalized)  Other abnormalities of gait and mobility     Problem List Patient Active Problem List   Diagnosis Date Noted   Multinodular goiter 01/26/2021   Atrophic vaginitis 04/03/2019   Dysuria 04/03/2019   Fatigue 04/03/2019   Female stress incontinence 04/03/2019   Glycosuria 04/03/2019   Hypercholesterolemia 04/03/2019   Increased frequency of urination 04/03/2019   Menopausal syndrome 04/03/2019   Chronic migraine without aura without status migrainosus, not intractable 06/15/2018   Hearing loss, left 06/15/2018   Migraine 04/07/2018   Referred otalgia of right ear 04/07/2018   Chronic idiopathic constipation 10/11/2017   Left-sided low back pain without sciatica 10/04/2014   Status post lumbar laminectomy 02/05/2014   Lumbar stenosis 01/22/2014   Anxiety 01/07/2014   Depression with anxiety 04/02/2013   Ihor Austin, LPTA/CLT; CBIS [phone]  Aldona Lento 03/12/2021, 2:16 PM  [place], [place], [address] Phone: [phone]   Fax:  [phone]  Name: Madison Reyes MRN: [id number] Date of Birth: 04-Oct-1960

## 2021-03-17 ENCOUNTER — Other Ambulatory Visit: Payer: Self-pay

## 2021-03-17 ENCOUNTER — Ambulatory Visit (HOSPITAL_COMMUNITY): Payer: Commercial Managed Care - PPO | Admitting: Physical Therapy

## 2021-03-17 DIAGNOSIS — R262 Difficulty in walking, not elsewhere classified: Secondary | ICD-10-CM

## 2021-03-17 DIAGNOSIS — R2689 Other abnormalities of gait and mobility: Secondary | ICD-10-CM

## 2021-03-17 DIAGNOSIS — M6281 Muscle weakness (generalized): Secondary | ICD-10-CM

## 2021-03-17 NOTE — Therapy (Signed)
[place], [place], [address] Phone: [phone]   Fax:  [phone]  Physical Therapy Treatment  Patient Details  Name: Madison Reyes MRN: [id number] Date of Birth: 1960/12/22 Referring Provider (PT): Frederik Pear, MD   Encounter Date: 03/17/2021   PT End of Session - 03/17/21 1315     Visit Number 9    Number of Visits 12    Date for PT Re-Evaluation 03/31/21    Authorization Type UMR/UHC PPO, no auth 30 VL    Authorization - Visit Number 9    Authorization - Number of Visits 30    Progress Note Due on Visit 12    PT Start Time 1316    PT Stop Time 1358    PT Time Calculation (min) 42 min    Activity Tolerance Patient tolerated treatment well    Behavior During Therapy WFL for tasks assessed/performed             Past Medical History:  Diagnosis Date   Anxiety    Fibroids    h/o   H/O dysmenorrhea    H/O menorrhagia    High cholesterol    Menopausal symptoms    h/o   Migraine     Past Surgical History:  Procedure Laterality Date   ABDOMINAL HYSTERECTOMY     BACK SURGERY  2015   disc removal   LIPOMA EXCISION     MYOMECTOMY     ovarian fibroid     UTERINE FIBROID SURGERY      There were no vitals filed for this visit.                      [facility] Adult PT Treatment/Exercise - 03/17/21 0001       Knee/Hip Exercises: Stretches   Active Hamstring Stretch Both;3 reps;30 seconds    Active Hamstring Stretch Limitations 12" step      Knee/Hip Exercises: Machines for Strengthening   Total Gym Leg Press 3x 10  4Pl      Knee/Hip Exercises: Standing   Hip Abduction Both;20 reps    Hip Extension Both;20 reps    Lateral Step Up Left;15 reps;Hand Hold: 0;Hand Hold: 1;Step Height: 6"    Forward Step Up Left;15 reps;Hand Hold: 1;Step Height: 6"    Step Down Left;15 reps;Hand Hold: 1;Hand Hold: 0;Step Height: 6"                      PT Short Term Goals -  02/17/21 1518       PT SHORT TERM GOAL #1   Title Patient will be independent with HEP in order to improve functional outcomes.    Time 3    Period Weeks    Status New    Target Date 03/10/21      PT SHORT TERM GOAL #2   Title Patient will report at least 25% improvement in symptoms for improved quality of life.    Baseline 7/10 left hip/thigh pain    Time 3    Period Weeks    Status New    Target Date 03/10/21      PT SHORT TERM GOAL #3   Title Patient will demo left hip flexion to 90 degrees to improve independence and ease of mobility    Baseline 70 degrees left hip flexion, 20 degrees PROM abduction    Time 3    Period Weeks    Status New  Target Date 03/10/21      PT SHORT TERM GOAL #4   Title Demonstrate improved ambulation tolerance as evidenced by 250 ft during 2MWT with least restrictive AD    Baseline 180 ft with RW and gait deviations    Time 3    Period Weeks    Status New    Target Date 03/10/21               PT Long Term Goals - 02/17/21 1520       PT LONG TERM GOAL #1   Title Patient will improve FOTO score to meet predicted outcomes    Baseline 15% function    Time 6    Period Weeks    Status New    Target Date 03/31/21      PT LONG TERM GOAL #2   Title Patient will demo 300 ft distance during 2MWT with cane or without AD to improve community ambulation    Baseline 180 ft with RW    Time 6    Period Weeks    Status New    Target Date 03/31/21                   Plan - 03/17/21 1457     Clinical Impression Statement Continued with LE strengthening exercises.  Resumed step ups/downs with focus on eccentric control.  Pt attempted to complete without UE assist, however still needs 1 UE to help stabilize.  Instructed with standing hamstring stretch with noted tightness bilaterally.   Able to increase weights to 4 plates with leg press without pain or issues.  Pt is walking without antalgia or pain without AD and encouraged to  discontinue use of SPC.    Personal Factors and Comorbidities Time since onset of injury/illness/exacerbation    Examination-Activity Limitations Bed Mobility;Bend;Carry;Lift;Stand;Stairs;Squat;Locomotion Level;Transfers    Examination-Participation Restrictions Church;Cleaning;Community Activity;Driving;Yard Work;Shop;Occupation    Stability/Clinical Decision Making Stable/Uncomplicated    Rehab Potential Excellent    PT Frequency 2x / week    PT Duration 6 weeks    PT Treatment/Interventions ADLs/Self Care Home Management;Cryotherapy;Electrical Stimulation;DME Instruction;Moist Heat;Gait training;Stair training;Functional mobility training;Therapeutic activities;Therapeutic exercise;Balance training;Patient/family education;Manual techniques;Passive range of motion    PT Next Visit Plan discuss further limitations/area of focus and continue to progress towards goals.    PT Home Exercise Plan QS, heel slides, hip add (ball squeeze); LAQ, standing h/s curl, sidestepping with red t-loop; 7/14: vector stance; 03/12/21: sidelying clam and abd    Consulted and Agree with Plan of Care Patient             Patient will benefit from skilled therapeutic intervention in order to improve the following deficits and impairments:  Abnormal gait, Decreased activity tolerance, Decreased mobility, Decreased endurance, Decreased range of motion, Decreased strength, Difficulty walking, Impaired perceived functional ability, Pain  Visit Diagnosis: Difficulty in walking, not elsewhere classified  Muscle weakness (generalized)  Other abnormalities of gait and mobility     Problem List Patient Active Problem List   Diagnosis Date Noted   Multinodular goiter 01/26/2021   Atrophic vaginitis 04/03/2019   Dysuria 04/03/2019   Fatigue 04/03/2019   Female stress incontinence 04/03/2019   Glycosuria 04/03/2019   Hypercholesterolemia 04/03/2019   Increased frequency of urination 04/03/2019   Menopausal  syndrome 04/03/2019   Chronic migraine without aura without status migrainosus, not intractable 06/15/2018   Hearing loss, left 06/15/2018   Migraine 04/07/2018   Referred otalgia of right ear 04/07/2018   Chronic idiopathic  constipation 10/11/2017   Left-sided low back pain without sciatica 10/04/2014   Status post lumbar laminectomy 02/05/2014   Lumbar stenosis 01/22/2014   Anxiety 01/07/2014   Depression with anxiety 04/02/2013   Teena Irani, PTA/CLT [phone]  Teena Irani 03/17/2021, 3:00 PM  [facility] [address] [place], [place], [address] Phone: [phone]   Fax:  [phone]  Name: Madison Reyes MRN: [id number] Date of Birth: [birth year]

## 2021-03-18 ENCOUNTER — Encounter (HOSPITAL_COMMUNITY): Payer: Self-pay

## 2021-03-18 ENCOUNTER — Ambulatory Visit (HOSPITAL_COMMUNITY): Payer: Commercial Managed Care - PPO

## 2021-03-18 DIAGNOSIS — R2689 Other abnormalities of gait and mobility: Secondary | ICD-10-CM

## 2021-03-18 DIAGNOSIS — M6281 Muscle weakness (generalized): Secondary | ICD-10-CM

## 2021-03-18 DIAGNOSIS — R262 Difficulty in walking, not elsewhere classified: Secondary | ICD-10-CM | POA: Diagnosis not present

## 2021-03-18 NOTE — Therapy (Addendum)
[facility] [address] [place], [place], [address] Phone: [phone]   Fax:  [phone]  Physical Therapy Treatment and D/C Summary  Patient Details  Name: Madison Reyes MRN: [id number] Date of Birth: Jun 25, 1961 Referring Provider (PT): Frederik Pear, MD  PHYSICAL THERAPY DISCHARGE SUMMARY  Visits from Start of Care: 10  Current functional level related to goals / functional outcomes: All goals met   Remaining deficits: none   Education / Equipment: Advanced HEP   Patient agrees to discharge. Patient goals were met. Patient is being discharged due to meeting the stated rehab goals.  Encounter Date: 03/18/2021   PT End of Session - 03/18/21 0900     Visit Number 10    Number of Visits 12    Date for PT Re-Evaluation 03/31/21    Authorization Type [employer] PPO, no auth 30 VL    Authorization - Visit Number 10    Authorization - Number of Visits 30    Progress Note Due on Visit 12    PT Start Time 0835    PT Stop Time 0914    PT Time Calculation (min) 39 min    Activity Tolerance Patient tolerated treatment well    Behavior During Therapy WFL for tasks assessed/performed             Past Medical History:  Diagnosis Date   Anxiety    Fibroids    h/o   H/O dysmenorrhea    H/O menorrhagia    High cholesterol    Menopausal symptoms    h/o   Migraine     Past Surgical History:  Procedure Laterality Date   ABDOMINAL HYSTERECTOMY     BACK SURGERY  2015   disc removal   LIPOMA EXCISION     MYOMECTOMY     ovarian fibroid     UTERINE FIBROID SURGERY      There were no vitals filed for this visit.   Subjective Assessment - 03/18/21 0837     Subjective No pain, reports some stiffness in the hip today.  Feels she has improved 95%.    Currently in Pain? No/denies                [facility] PT Assessment - 03/18/21 0001       Assessment   Medical Diagnosis left THR; anterior approach    Referring  Provider (PT) Frederik Pear, MD    Onset Date/Surgical Date 02/13/21    Next MD Visit 03/26/21      Precautions   Precautions Anterior Hip      Restrictions   Weight Bearing Restrictions Yes    LLE Weight Bearing Weight bearing as tolerated      Observation/Other Assessments   Focus on Therapeutic Outcomes (FOTO)  67% functional   was 15% function     AROM   Left Hip Extension 10    Left Hip Flexion 98   was 70   Left Hip ABduction --   was 20     Strength   Strength Assessment Site Hip;Knee;Ankle    Right/Left Hip Right;Left    Right Hip Flexion 5/5   eval: not tested   Right Hip Extension 4/5   eval: not tested   Right Hip ABduction 4+/5   eval: not tested   Left Hip Flexion 4/5   eval: not tested   Left Hip Extension 4/5   eval: not tested   Left Hip ABduction 4-/5   eval:  not tested   Right/Left Knee Right;Left    Right Knee Flexion 5/5   eval: not tested   Right Knee Extension 5/5   eval: not tested   Left Knee Flexion 5/5   eval: not tested   Left Knee Extension 5/5   eval: not tested   Right/Left Ankle Right;Left    Right Ankle Dorsiflexion 5/5   eval: not tested   Right Ankle Plantar Flexion 4+/5   eval: not tested   Left Ankle Dorsiflexion 5/5   eval: not tested   Left Ankle Plantar Flexion 4+/5   eval: not tested     Ambulation/Gait   Ambulation/Gait Yes    Ambulation/Gait Assistance 7: Independent    Ambulation Distance (Feet) 548 Feet   was180 with RW   Assistive device None    Gait Pattern Step-through pattern    Ambulation Surface Level;Indoor    Gait velocity WNL, jogging at end of 2MWT    Gait Comments 2MWT      Standardized Balance Assessment   Standardized Balance Assessment Five Times Sit to Stand    Five times sit to stand comments  11.90" no HHA                           [facility] Adult PT Treatment/Exercise - 03/18/21 0001       Knee/Hip Exercises: Standing   Heel Raises 20 reps    Functional Squat 10 reps;2 sets    Gait  Training 2MWT      Knee/Hip Exercises: Sidelying   Hip ABduction 10 reps      Knee/Hip Exercises: Prone   Hip Extension 10 reps                      PT Short Term Goals - 03/18/21 5997       PT SHORT TERM GOAL #1   Title Patient will be independent with HEP in order to improve functional outcomes.    Baseline 03/18/21:  Reports compliance wiht advanced HEP 3x daily    Status Achieved      PT SHORT TERM GOAL #2   Title Patient will report at least 25% improvement in symptoms for improved quality of life.    Baseline 03/18/21:  Reports 95% improvements    Status Achieved      PT SHORT TERM GOAL #3   Title Patient will demo left hip flexion to 90 degrees to improve independence and ease of mobility    Baseline 03/18/21: 98degree flexion; was 70 degrees left hip flexion, 20 degrees PROM abduction    Status Achieved      PT SHORT TERM GOAL #4   Title Demonstrate improved ambulation tolerance as evidenced by 250 ft during 2MWT with least restrictive AD    Baseline 03/18/21: 89f no AD, no antalgic gait mechanics; was eval: 180 ft with RW and gait deviations    Status Achieved               PT Long Term Goals - 03/18/21 0853       PT LONG TERM GOAL #1   Title Patient will improve FOTO score to meet predicted outcomes    Baseline 03/18/21: 67% functional; was 15% function    Status Achieved      PT LONG TERM GOAL #2   Title Patient will demo 300 ft distance during 2MWT with cane or without AD to improve community ambulation  Baseline 03/18/21: 58f 2MWT no AD; was 180 ft with RW    Status Achieved                   Plan - 03/18/21 1014     Clinical Impression Statement 10th visit progress note and pt progressing excellent.  Pt has met 4/4 STGs, 2/2 LTGs.  Pt presents with improved with improved hip mobility, increased cadence without need of AD and no antalgic gait mechanics, pt able to demonstrate reciprocal pattern ascending and descending stairs  without HR need and strength progressing well.  Pt given advanced HEP handout to address gluteal weakness.  Improved self perceived functional abilities noted with FOTO score.    Personal Factors and Comorbidities Time since onset of injury/illness/exacerbation    Examination-Activity Limitations Bed Mobility;Bend;Carry;Lift;Stand;Stairs;Squat;Locomotion Level;Transfers    Examination-Participation Restrictions Church;Cleaning;Community Activity;Driving;Yard Work;Shop;Occupation    Stability/Clinical Decision Making Stable/Uncomplicated    Clinical Decision Making Low    Rehab Potential Excellent    PT Frequency 2x / week    PT Duration 6 weeks    PT Treatment/Interventions ADLs/Self Care Home Management;Cryotherapy;Electrical Stimulation;DME Instruction;Moist Heat;Gait training;Stair training;Functional mobility training;Therapeutic activities;Therapeutic exercise;Balance training;Patient/family education;Manual techniques;Passive range of motion    PT Next Visit Plan DC to HEP per all goals.    PT Home Exercise Plan QS, heel slides, hip add (ball squeeze); LAQ, standing h/s curl, sidestepping with red t-loop; 7/14: vector stance; 03/12/21: sidelying clam and abd; 7/27: SLR all directions, bridge, squat    Consulted and Agree with Plan of Care Patient             Patient will benefit from skilled therapeutic intervention in order to improve the following deficits and impairments:  Abnormal gait, Decreased activity tolerance, Decreased mobility, Decreased endurance, Decreased range of motion, Decreased strength, Difficulty walking, Impaired perceived functional ability, Pain  Visit Diagnosis: Difficulty in walking, not elsewhere classified  Muscle weakness (generalized)  Other abnormalities of gait and mobility     Problem List Patient Active Problem List   Diagnosis Date Noted   Multinodular goiter 01/26/2021   Atrophic vaginitis 04/03/2019   Dysuria 04/03/2019   Fatigue  04/03/2019   Female stress incontinence 04/03/2019   Glycosuria 04/03/2019   Hypercholesterolemia 04/03/2019   Increased frequency of urination 04/03/2019   Menopausal syndrome 04/03/2019   Chronic migraine without aura without status migrainosus, not intractable 06/15/2018   Hearing loss, left 06/15/2018   Migraine 04/07/2018   Referred otalgia of right ear 04/07/2018   Chronic idiopathic constipation 10/11/2017   Left-sided low back pain without sciatica 10/04/2014   Status post lumbar laminectomy 02/05/2014   Lumbar stenosis 01/22/2014   Anxiety 01/07/2014   Depression with anxiety 04/02/2013   CIhor Austin LPTA/CLT; CBIS [phone] CAldona Lento7/27/2022, 10:24 AM  2:48 PM, 03/25/21 M. KSherlyn Lees PT, DPT Physical Therapist-  Office Number: [phone] [place]STroy [address] [address]Phone: [phone]  Fax:  [phone] Name: GDARRIN APODACAMRN: [id number] of Birth: [birth year]

## 2021-03-18 NOTE — Patient Instructions (Addendum)
Abduction    Lift leg up toward ceiling. Return.  Repeat 10 times each leg. Do 3 sessions per day.  http://gt2.exer.us/386   Copyright  VHI. All rights reserved.   Hip Extension: 2-4 Inches    Tighten gluteal muscle. Lift one leg 10times. Restabilize pelvis. Repeat with other leg. Keep pelvis still. Be sure pelvis does not rotate and back does not arch. Do 3 times per day.  http://ss.exer.us/63   Copyright  VHI. All rights reserved.   Bridge    Lie back, legs bent. Inhale, pressing hips up. Keeping ribs in, lengthen lower back. Exhale, rolling down along spine from top. Repeat 10 times. Do 3 sessions per day.  http://pm.exer.us/55   Copyright  VHI. All rights reserved.   FUNCTIONAL MOBILITY: Squat    Stance: shoulder-width on floor. Bend hips and knees. Keep back straight. Do not allow knees to bend past toes. Squeeze glutes and quads to stand. 10 reps per set, 2 sets per day, 4 days per week  Copyright  VHI. All rights reserved.

## 2021-03-19 ENCOUNTER — Ambulatory Visit (HOSPITAL_COMMUNITY): Payer: Commercial Managed Care - PPO

## 2021-03-24 ENCOUNTER — Ambulatory Visit (HOSPITAL_COMMUNITY): Payer: Commercial Managed Care - PPO

## 2021-07-14 ENCOUNTER — Encounter: Payer: Self-pay | Admitting: Family Medicine

## 2021-07-14 NOTE — Telephone Encounter (Signed)
Staff-I highly recommend a follow-up visit next week Please work with Peter Congo to set her up to see me next week-please reach out to her with a phone call We can adjust medications at that time Thanks-Dr. Nicki Reaper

## 2021-07-18 ENCOUNTER — Other Ambulatory Visit: Payer: Self-pay | Admitting: Family Medicine

## 2021-07-20 ENCOUNTER — Telehealth: Payer: Commercial Managed Care - PPO | Admitting: Family Medicine

## 2021-09-11 IMAGING — US US THYROID
1 series · 13 of 25 positions shown · non-contrast
Comparison: Prior thyroid ultrasound 11/24/2011

CLINICAL DATA: Hypothyroid.

EXAM:
THYROID ULTRASOUND
TECHNIQUE: Ultrasound examination of the thyroid gland and adjacent soft
tissues was performed.

[Series 1: us thyroid · 13 of 58 slices shown]
[im 1/58]
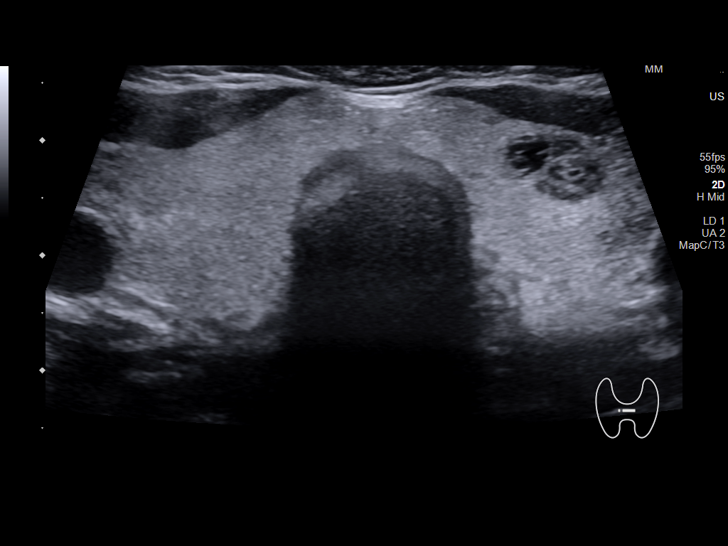
[im 5/58]
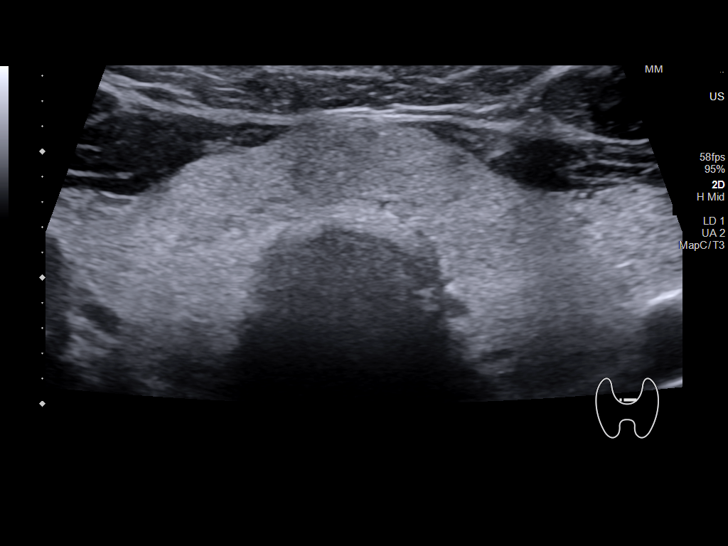
[im 10/58]
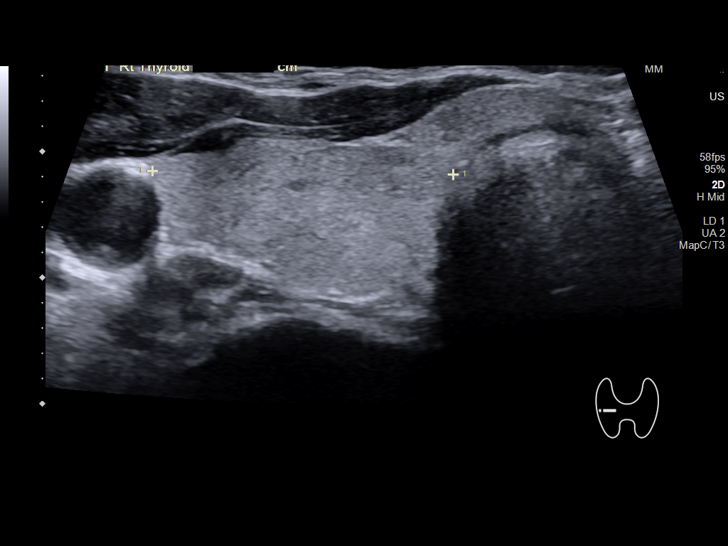
[im 15/58]
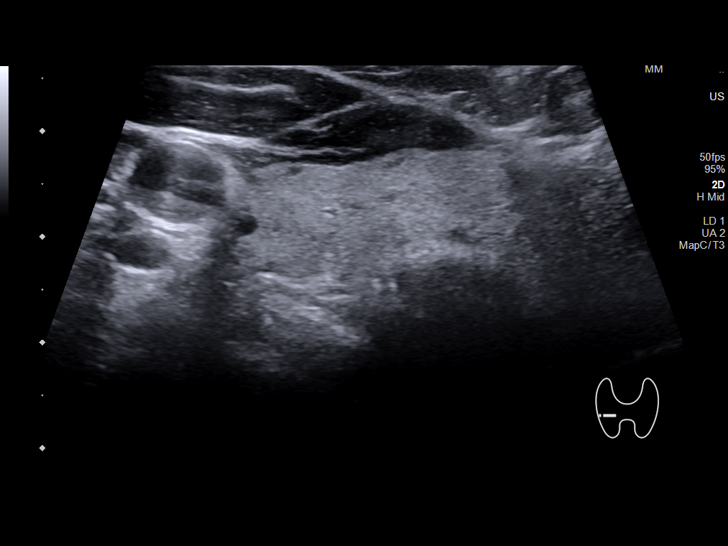
[im 20/58]
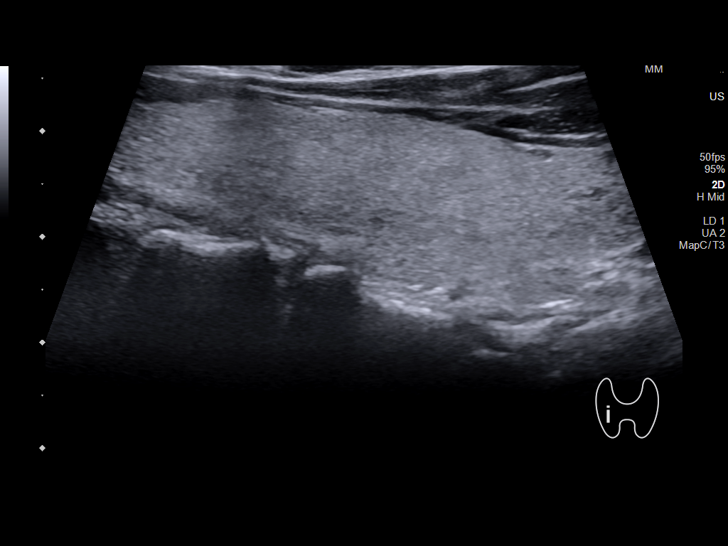
[im 24/58]
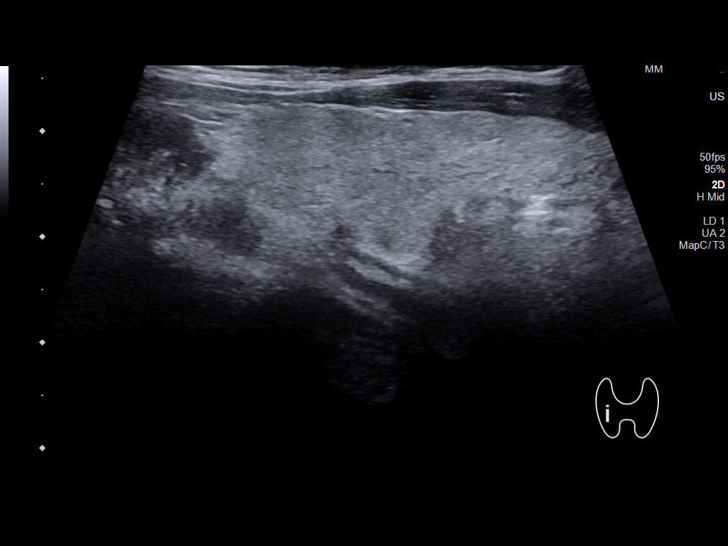
[im 29/58]
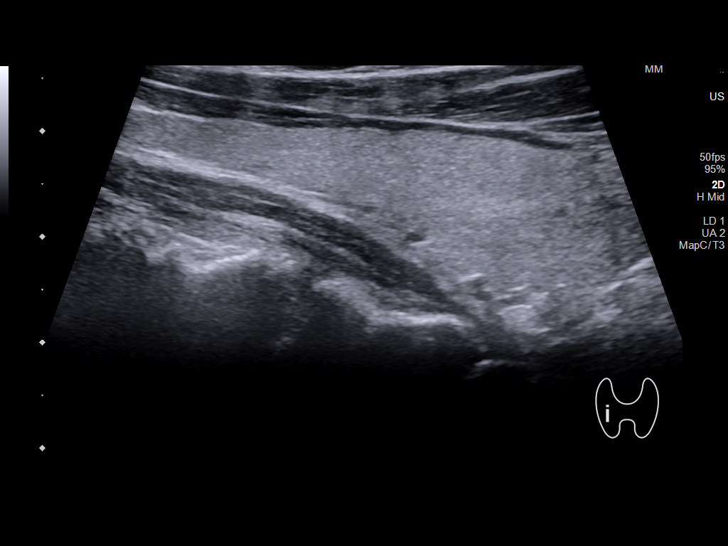
[im 34/58]
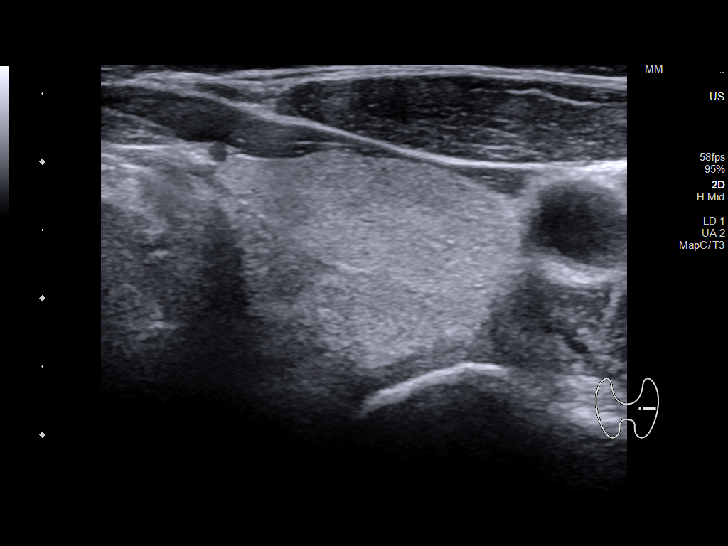
[im 39/58]
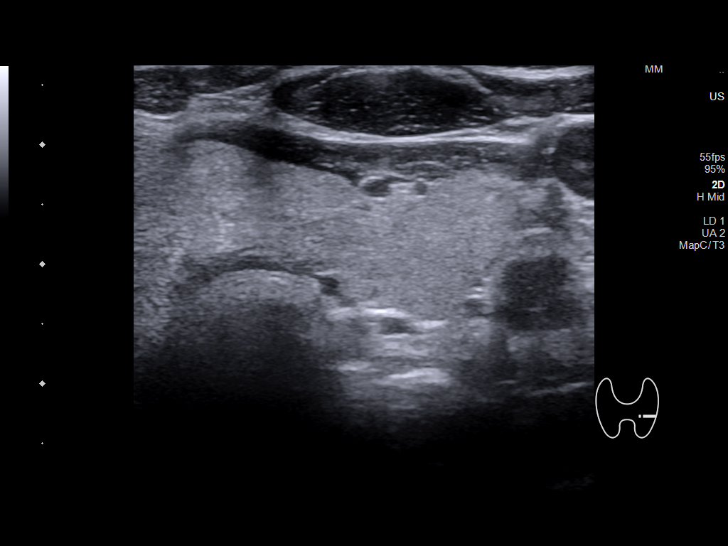
[im 43/58]
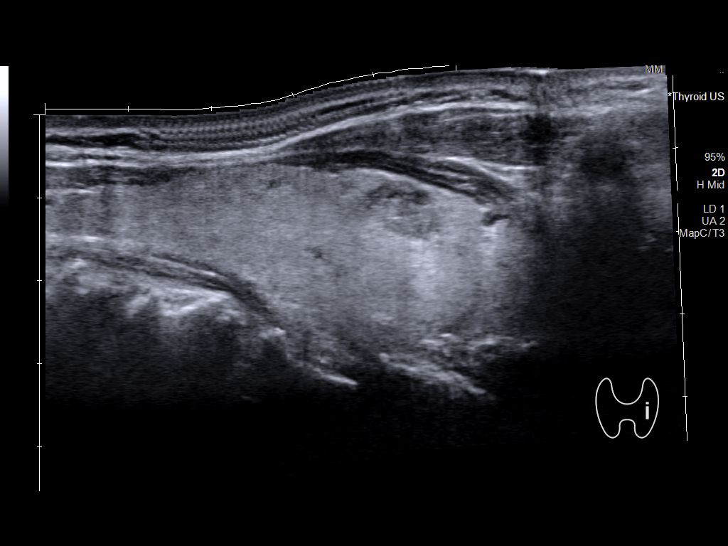
[im 48/58]
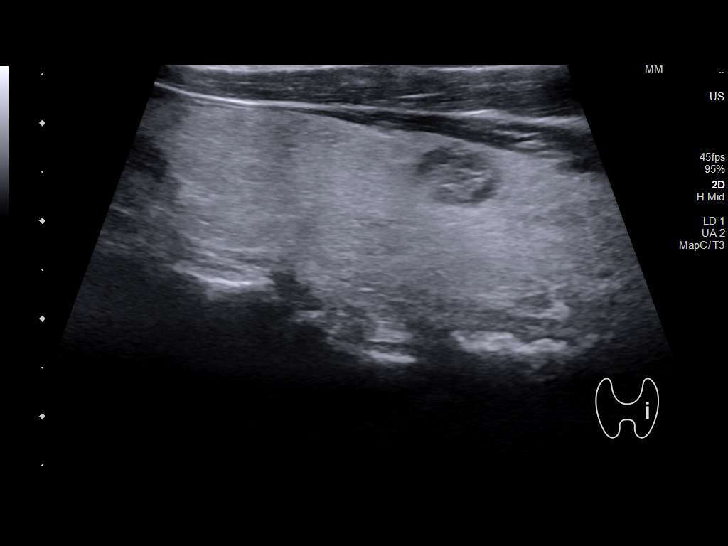
[im 53/58]
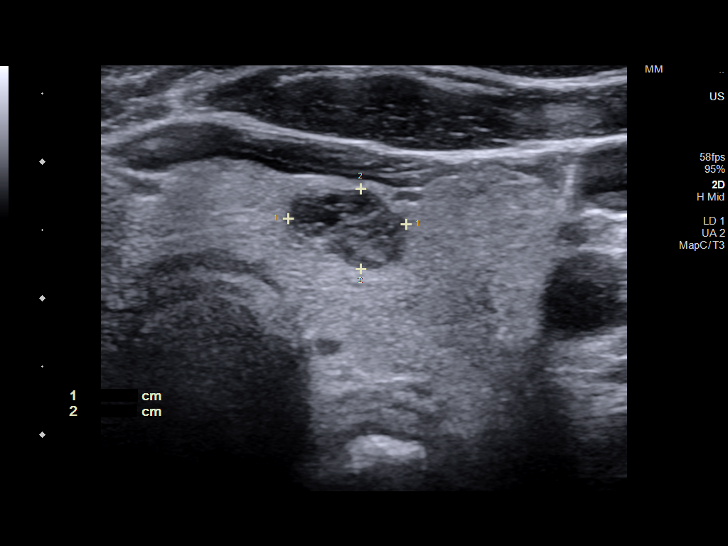
[im 58/58]
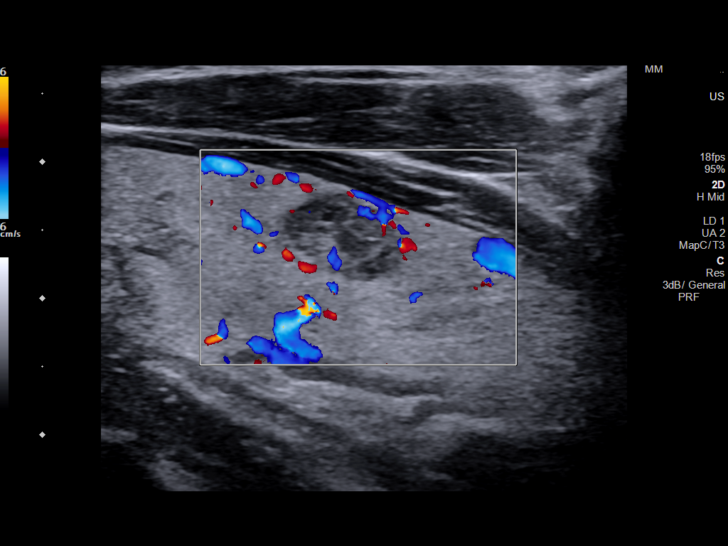

[13 of 25 positions shown; findings below may reference images not displayed]

FINDINGS: Parenchymal Echotexture: Mildly heterogenous

Isthmus: 0.8 cm

Right lobe: 5.6 x 1.6 x 2.4 cm

Left lobe: 6.4 x 1.9 x 1.9 cm

_________________________________________________________

Estimated total number of nodules >/= 1 cm: 1

Number of spongiform nodules >/=  2 cm not described below (TR1): 0

Number of mixed cystic and solid nodules >/= 1.5 cm not described
below (TR2): 0

_________________________________________________________

Nodule # 1:

Location: Left; Mid

Maximum size: 1 cm; Other 2 dimensions: 0.9 x 0.6 cm

Composition: solid/almost completely solid (2)

Echogenicity: hypoechoic (2)

Shape: not taller-than-wide (0)

Margins: smooth (0)

Echogenic foci: none (0)

ACR TI-RADS total points: 4.

ACR TI-RADS risk category: TR4 (4-6 points).

ACR TI-RADS recommendations:

*Given size (>/= 1 - 1.4 cm) and appearance, a follow-up ultrasound
in 1 year should be considered based on TI-RADS criteria.

_________________________________________________________
IMPRESSION: 1. Solitary 1 cm TI-RADS category 4 nodule in the left mid gland.
This nodule meets criteria for surveillance. Recommend follow-up
ultrasound in 1 year.

The above is in keeping with the ACR TI-RADS recommendations - [HOSPITAL] 5695;[DATE].

## 2021-10-12 ENCOUNTER — Telehealth: Payer: Self-pay | Admitting: Family Medicine

## 2021-10-12 DIAGNOSIS — Z79899 Other long term (current) drug therapy: Secondary | ICD-10-CM

## 2021-10-12 DIAGNOSIS — E7800 Pure hypercholesterolemia, unspecified: Secondary | ICD-10-CM

## 2021-10-12 NOTE — Telephone Encounter (Signed)
Patient has physical on 10/23/21 and needing labs done °

## 2021-10-16 ENCOUNTER — Other Ambulatory Visit: Payer: Self-pay | Admitting: Family Medicine

## 2021-10-16 NOTE — Telephone Encounter (Signed)
Blood work ordered in Epic. Patient notified. 

## 2021-10-16 NOTE — Telephone Encounter (Signed)
Nilda Simmer, NP    Not sure if we need to order thyroid tests (Dr. Dorris Fetch). If we are ordering, she needs a free T4 and TSH. Otherwise, CBC, CMP, Lipid. Thanks

## 2021-10-20 LAB — CBC WITH DIFFERENTIAL/PLATELET
Basophils Absolute: 0.1 10*3/uL (ref 0.0–0.2)
Basos: 1 %
EOS (ABSOLUTE): 0.1 10*3/uL (ref 0.0–0.4)
Eos: 2 %
Hematocrit: 39.6 % (ref 34.0–46.6)
Hemoglobin: 13.2 g/dL (ref 11.1–15.9)
Immature Grans (Abs): 0 10*3/uL (ref 0.0–0.1)
Immature Granulocytes: 0 %
Lymphocytes Absolute: 2.7 10*3/uL (ref 0.7–3.1)
Lymphs: 39 %
MCH: 32.4 pg (ref 26.6–33.0)
MCHC: 33.3 g/dL (ref 31.5–35.7)
MCV: 97 fL (ref 79–97)
Monocytes Absolute: 0.6 10*3/uL (ref 0.1–0.9)
Monocytes: 9 %
Neutrophils Absolute: 3.4 10*3/uL (ref 1.4–7.0)
Neutrophils: 49 %
Platelets: 260 10*3/uL (ref 150–450)
RBC: 4.07 x10E6/uL (ref 3.77–5.28)
RDW: 12.6 % (ref 11.7–15.4)
WBC: 6.9 10*3/uL (ref 3.4–10.8)

## 2021-10-20 LAB — LIPID PANEL
Chol/HDL Ratio: 4.7 ratio — ABNORMAL HIGH (ref 0.0–4.4)
Cholesterol, Total: 283 mg/dL — ABNORMAL HIGH (ref 100–199)
HDL: 60 mg/dL (ref >39)
LDL Chol Calc (NIH): 185 mg/dL — ABNORMAL HIGH (ref 0–99)
Triglycerides: 202 mg/dL — ABNORMAL HIGH (ref 0–149)
VLDL Cholesterol Cal: 38 mg/dL (ref 5–40)

## 2021-10-20 LAB — COMPREHENSIVE METABOLIC PANEL
ALT: 22 IU/L (ref 0–32)
AST: 22 IU/L (ref 0–40)
Albumin/Globulin Ratio: 1.8 (ref 1.2–2.2)
Albumin: 4.4 g/dL (ref 3.8–4.9)
Alkaline Phosphatase: 47 IU/L (ref 44–121)
BUN/Creatinine Ratio: 14 (ref 12–28)
BUN: 12 mg/dL (ref 8–27)
Bilirubin Total: 0.4 mg/dL (ref 0.0–1.2)
CO2: 23 mmol/L (ref 20–29)
Calcium: 9.5 mg/dL (ref 8.7–10.3)
Chloride: 107 mmol/L — ABNORMAL HIGH (ref 96–106)
Creatinine, Ser: 0.84 mg/dL (ref 0.57–1.00)
Globulin, Total: 2.4 g/dL (ref 1.5–4.5)
Glucose: 82 mg/dL (ref 70–99)
Potassium: 4.2 mmol/L (ref 3.5–5.2)
Sodium: 144 mmol/L (ref 134–144)
Total Protein: 6.8 g/dL (ref 6.0–8.5)
eGFR: 80 mL/min/{1.73_m2} (ref >59)

## 2021-10-23 ENCOUNTER — Other Ambulatory Visit: Payer: Self-pay

## 2021-10-23 ENCOUNTER — Encounter: Payer: Self-pay | Admitting: Nurse Practitioner

## 2021-10-23 ENCOUNTER — Ambulatory Visit (INDEPENDENT_AMBULATORY_CARE_PROVIDER_SITE_OTHER): Payer: Commercial Managed Care - PPO | Admitting: Nurse Practitioner

## 2021-10-23 VITALS — BP 124/82 | HR 84 | Temp 97.4°F | Ht 65.5 in | Wt 159.0 lb

## 2021-10-23 DIAGNOSIS — R3989 Other symptoms and signs involving the genitourinary system: Secondary | ICD-10-CM | POA: Diagnosis not present

## 2021-10-23 DIAGNOSIS — E785 Hyperlipidemia, unspecified: Secondary | ICD-10-CM

## 2021-10-23 DIAGNOSIS — N951 Menopausal and female climacteric states: Secondary | ICD-10-CM | POA: Diagnosis not present

## 2021-10-23 DIAGNOSIS — J300 Vasomotor rhinitis: Secondary | ICD-10-CM | POA: Diagnosis not present

## 2021-10-23 DIAGNOSIS — F418 Other specified anxiety disorders: Secondary | ICD-10-CM

## 2021-10-23 LAB — POCT URINALYSIS DIPSTICK
Spec Grav, UA: 1.02 (ref 1.010–1.025)
pH, UA: 6.5 (ref 5.0–8.0)

## 2021-10-23 MED ORDER — ESCITALOPRAM OXALATE 10 MG PO TABS: 10.0000 mg | ORAL_TABLET | Freq: Every day | ORAL | 0 refills | Status: DC

## 2021-10-23 NOTE — Progress Notes (Signed)
? ?  Subjective:  ? ? Patient ID: Madison Reyes, female    DOB: [birth year], 60 y.o.   MRN: [id number] ? ?HPI ?The patient comes in today for a wellness visit. ? ? ? ?A review of their health history was completed. ? A review of medications was also completed. ? ?Any needed refills; none at this time ? ?Eating habits: pretty good ? ?Falls/  MVA accidents in past few months: none ? ?Regular exercise: walks some  ? ?Specialist pt sees on regular basis: none ? ?Preventative health issues were discussed.  ? ?Additional concerns: increased anxiety.  ? ? ?Review of Systems ? ?   ?Objective:  ? Physical Exam ? ? ? ? ?   ?Assessment & Plan:  ? ? ?

## 2021-10-23 NOTE — Progress Notes (Signed)
? ?Subjective:  ? ? Patient ID: Madison Reyes, female    DOB: 1961/05/29, 61 y.o.   MRN: [id number] ? ?HPI ? ?The patient comes in today for a wellness visit.  s/p hysterectomy and currently receives Hormone replacement therapy pellets every 3 months including estrogen and testosterone and daily oral progesterone.  Desired to loss more weight as goal is 140 lbs. Report healthy diet and will resume daily exercise. Report feeling anxious and easily overwhelmed and depressed despite taken Wellbutrin as prescribed. Denies suicidal or homicidal thoughts or ideation. Frequency, urgency with occasional orange tinge urine color. Denies any rash, itching or lesions, vaginal bleeding, and pain Defers pelvic exam. Up to date on vaccination for COVID 19, colonoscopy and mammogram. Defers influenza and shingles vaccine.  Routine dental and eye exams completed  last year. She denies alcohol, tobacco, or illicit drug use. Sexually active with same female partner.  ? ?Review of Systems  ?Constitutional:  Positive for fatigue. Negative for fever.  ?HENT:  Negative for congestion, ear discharge, ear pain, postnasal drip, sore throat and trouble swallowing.   ?Respiratory:  Negative for cough, chest tightness and shortness of breath.   ?Cardiovascular:  Negative for chest pain.  ?Gastrointestinal:  Negative for abdominal pain, constipation, diarrhea, nausea and vomiting.  ?Genitourinary:  Positive for frequency and urgency. Negative for dysuria.  ?Neurological:  Negative for headaches.  ?Psychiatric/Behavioral:  Negative for suicidal ideas. The patient is nervous/anxious.   ? ?   ?Objective:  ?  ?Physical Exam ?Vitals and nursing note reviewed.  ?Constitutional:   ?   Appearance: Normal appearance.  ?HENT:  ?   Head:  ?   Comments: Left TM minimally retracted; no erythema bilaterally ?   Left Ear: Tympanic membrane normal.  ?   Mouth/Throat:  ?   Mouth: Mucous membranes are moist.  ?   Pharynx: No oropharyngeal exudate or posterior  oropharyngeal erythema.  ?Neck:  ?   Comments: Thyroid non tender to palpation. Irregular texture with fine nodularity, no dominant mass.  Minimal anterior cervical adenopathy.  ?Cardiovascular:  ?   Rate and Rhythm: Normal rate and regular rhythm.  ?   Pulses: Normal pulses.  ?   Heart sounds: Normal heart sounds.  ?Pulmonary:  ?   Effort: Pulmonary effort is normal.  ?   Breath sounds: Normal breath sounds. No wheezing.  ?Abdominal:  ?   General: Abdomen is flat.  ?   Palpations: Abdomen is soft. There is no mass.  ?   Tenderness: There is no abdominal tenderness.  ?Skin: ?   General: Skin is warm and dry.  ?Neurological:  ?   Mental Status: She is alert and oriented to person, place, and time. Mental status is at baseline.  ?Psychiatric:     ?   Mood and Affect: Mood normal.     ?   Behavior: Behavior normal.     ?   Thought Content: Thought content normal.     ?   Judgment: Judgment normal.  ? ? ?Today's Vitals  ? 10/23/21 0946  ?BP: 124/82  ?Pulse: 84  ?Temp: (!) 97.4 ?F (36.3 ?C)  ?SpO2: 100%  ?Weight: 159 lb (72.1 kg)  ?Height: 5' 5.5" (1.664 m)  ? ?Body mass index is 26.06 kg/m?Marland Kitchen  ?GAD 7 : Generalized Anxiety Score 10/23/2021 05/09/2019 11/01/2017  ?Nervous, Anxious, on Edge 3 1 3   ?Control/stop worrying 3 2 3   ?Worry too much - different things 3 2 3   ?Trouble relaxing 3  1 3  ?Restless 3 0 0  ?Easily annoyed or irritable 3 1 3   ?Afraid - awful might happen 2 1 3   ?Total GAD 7 Score 20 8 18   ?Anxiety Difficulty - Not difficult at all Somewhat difficult  ? ?Depression screen [facility] 2/9 10/23/2021  ?Decreased Interest 1  ?Down, Depressed, Hopeless 1  ?PHQ - 2 Score 2  ?Altered sleeping 2  ?Tired, decreased energy 2  ?Change in appetite 2  ?Feeling bad or failure about yourself  2  ?Trouble concentrating 2  ?Moving slowly or fidgety/restless 2  ?Suicidal thoughts 0  ?PHQ-9 Score 14  ?Difficult doing work/chores Somewhat difficult  ? ?Recent Results (from the past 2160 hour(s))  ?CBC with Differential/Platelet      Status: None  ? Collection Time: 10/19/21  9:34 AM  ?Result Value Ref Range  ? WBC 6.9 3.4 - 10.8 x10E3/uL  ? RBC 4.07 3.77 - 5.28 x10E6/uL  ? Hemoglobin 13.2 11.1 - 15.9 g/dL  ? Hematocrit 39.6 34.0 - 46.6 %  ? MCV 97 79 - 97 fL  ? MCH 32.4 26.6 - 33.0 pg  ? MCHC 33.3 31.5 - 35.7 g/dL  ? RDW 12.6 11.7 - 15.4 %  ? Platelets 260 150 - 450 x10E3/uL  ? Neutrophils 49 Not Estab. %  ? Lymphs 39 Not Estab. %  ? Monocytes 9 Not Estab. %  ? Eos 2 Not Estab. %  ? Basos 1 Not Estab. %  ? Neutrophils Absolute 3.4 1.4 - 7.0 x10E3/uL  ? Lymphocytes Absolute 2.7 0.7 - 3.1 x10E3/uL  ? Monocytes Absolute 0.6 0.1 - 0.9 x10E3/uL  ? EOS (ABSOLUTE) 0.1 0.0 - 0.4 x10E3/uL  ? Basophils Absolute 0.1 0.0 - 0.2 x10E3/uL  ? Immature Granulocytes 0 Not Estab. %  ? Immature Grans (Abs) 0.0 0.0 - 0.1 x10E3/uL  ?Comprehensive metabolic panel     Status: Abnormal  ? Collection Time: 10/19/21  9:34 AM  ?Result Value Ref Range  ? Glucose 82 70 - 99 mg/dL  ? BUN 12 8 - 27 mg/dL  ? Creatinine, Ser 0.84 0.57 - 1.00 mg/dL  ? eGFR 80 >59 mL/min/1.73  ? BUN/Creatinine Ratio 14 12 - 28  ? Sodium 144 134 - 144 mmol/L  ? Potassium 4.2 3.5 - 5.2 mmol/L  ? Chloride 107 (H) 96 - 106 mmol/L  ? CO2 23 20 - 29 mmol/L  ? Calcium 9.5 8.7 - 10.3 mg/dL  ? Total Protein 6.8 6.0 - 8.5 g/dL  ? Albumin 4.4 3.8 - 4.9 g/dL  ? Globulin, Total 2.4 1.5 - 4.5 g/dL  ? Albumin/Globulin Ratio 1.8 1.2 - 2.2  ? Bilirubin Total 0.4 0.0 - 1.2 mg/dL  ? Alkaline Phosphatase 47 44 - 121 IU/L  ? AST 22 0 - 40 IU/L  ? ALT 22 0 - 32 IU/L  ?Lipid panel     Status: Abnormal  ? Collection Time: 10/19/21  9:34 AM  ?Result Value Ref Range  ? Cholesterol, Total 283 (H) 100 - 199 mg/dL  ? Triglycerides 202 (H) 0 - 149 mg/dL  ? HDL 60 >39 mg/dL  ? VLDL Cholesterol Cal 38 5 - 40 mg/dL  ? LDL Chol Calc (NIH) 185 (H) 0 - 99 mg/dL  ? Chol/HDL Ratio 4.7 (H) 0.0 - 4.4 ratio  ?  Comment:  T. Chol/HDL Ratio ?                                            Men  Women ?                               1/2 Avg.Risk  3.4    3.3 ?                                  Avg.Risk  5.0    4.4 ?                               2X Avg.Risk  9.6    7.1 ?                               3X Avg.Risk 23.4   11.0 ?  ?POCT Urinalysis Dipstick     Status: None  ? Collection Time: 10/23/21 10:45 AM  ?Result Value Ref Range  ? Color, UA    ? Clarity, UA    ? Glucose, UA    ? Bilirubin, UA    ? Ketones, UA    ? Spec Grav, UA 1.020 1.010 - 1.025  ? Blood, UA    ? pH, UA 6.5 5.0 - 8.0  ? Protein, UA    ? Urobilinogen, UA    ? Nitrite, UA    ? Leukocytes, UA    ? Appearance    ? Odor    ? ? ?  ?   ?Assessment & Plan:  ? ?Problem List Items Addressed This Visit   ? ?  ? Other  ? Depression with anxiety - Primary  ? Relevant Medications  ? escitalopram (LEXAPRO) 10 MG tablet  ? Hyperlipidemia  ? Menopausal syndrome  ? ?Other Visit Diagnoses   ? ? Abnormal urine color      ? Relevant Orders  ? POCT Urinalysis Dipstick (Completed)  ? Vasomotor rhinitis      ? ?  ? ? ? ?Meds ordered this encounter  ?Medications  ? escitalopram (LEXAPRO) 10 MG tablet  ?  Sig: Take 1 tablet (10 mg total) by mouth daily.  ?  Dispense:  30 tablet  ?  Refill:  0  ?  Order Specific Question:   Supervising Provider  ?  Answer:   Sallee Lange A [[id number]]  ?  ? ?Recommended Tdap vaccine  ?Pelvic and breast scheduled for Monday with gynecologist ?Since she is starting a new medication today, will discuss starting statin in one month due to high risk of heart disease in family and abnormal lipid panel.  ?Patient voiced concern about trying to stay on HRT and Benefits and risks discussed with patient due to age and number of years on HRT. Discussed research regarding HRT; limited on the use of pellets; she will also discuss with GYN on Monday.  ?Discussed the importance of stress reduction   ?Daily Exercises and Healthy eating habit.  ?Continue Wellbutrin. Will add Lexapro for anxiety. Reviewed potential adverse effects. DC med and contact office if any  problems.  ? ?Return in about 1 month (around 11/23/2021)  for phone or video fine.  ? ? ?

## 2021-10-23 NOTE — Patient Instructions (Signed)
OTC Nasal spray ?Daily oral Antihistamine   ?Tdap Vaccine at Pharmacy ?

## 2021-11-11 ENCOUNTER — Encounter: Payer: Self-pay | Admitting: Nurse Practitioner

## 2021-12-03 ENCOUNTER — Encounter: Payer: Self-pay | Admitting: Nurse Practitioner

## 2021-12-06 ENCOUNTER — Telehealth: Payer: Self-pay | Admitting: Family Medicine

## 2021-12-06 NOTE — Telephone Encounter (Signed)
Nurses ?Please see labs sent by [facility] ?Patient in a medical study ?Her glucose was on the low end during her blood work ?Patient sent my chart message to [place] ?Indicated that she was doing keto diet ?The problem with keto diet is a person can have low glucoses which can make a person feel slightly anxious ?Can also run the risk of passing out ?I would recommend a diet that is primarily vegetables fruits with minimal lean meats ?She should do a follow-up visit to discuss further at her convenience with myself or Hoyle Sauer ?Her cholesterol is also significantly elevated that could be discussed at a follow-up visit ? ?

## 2021-12-07 NOTE — Telephone Encounter (Signed)
Patient advised per Dr Nicki Reaper: Her glucose was on the low end during her blood work ?Patient sent my chart message to [place] ?Indicated that she was doing keto diet ?The problem with keto diet is a person can have low glucoses which can make a person feel slightly anxious ?Can also run the risk of passing out ?Dr Nicki Reaper would recommend a diet that is primarily vegetables fruits with minimal lean meats ?She should do a follow-up visit to discuss further at her convenience with myself or Hoyle Sauer ?Her cholesterol is also significantly elevated that could be discussed at a follow-up visit ?Patient verbalized understanding and stated she is not really doing the [place] currently and will schedule a follow up office visit to discuss cholesterol. ? ?

## 2021-12-08 ENCOUNTER — Telehealth: Payer: Self-pay | Admitting: Family Medicine

## 2021-12-08 NOTE — Telephone Encounter (Signed)
Hoyle Sauer ?Dropped off form for your consideration ? ?It appears to be linked to my chart message from April 13 ?I put this letter/form on your desk ?Thanks-Darcy Barbara ? ? ?

## 2021-12-11 ENCOUNTER — Encounter: Payer: Commercial Managed Care - PPO | Admitting: Family Medicine

## 2022-01-01 ENCOUNTER — Encounter: Payer: Self-pay | Admitting: Nurse Practitioner

## 2022-01-15 ENCOUNTER — Other Ambulatory Visit: Payer: Self-pay | Admitting: Family Medicine

## 2022-01-15 ENCOUNTER — Other Ambulatory Visit: Payer: Self-pay | Admitting: Nurse Practitioner

## 2022-01-20 ENCOUNTER — Other Ambulatory Visit: Payer: Self-pay | Admitting: "Endocrinology

## 2022-01-20 DIAGNOSIS — E039 Hypothyroidism, unspecified: Secondary | ICD-10-CM

## 2022-01-26 ENCOUNTER — Ambulatory Visit: Payer: Commercial Managed Care - PPO | Admitting: "Endocrinology

## 2022-01-26 LAB — T4, FREE: Free T4: 0.94 ng/dL (ref 0.82–1.77)

## 2022-01-26 LAB — TSH: TSH: 0.906 u[IU]/mL (ref 0.450–4.500)

## 2022-01-26 LAB — T3, FREE: T3, Free: 2.4 pg/mL (ref 2.0–4.4)

## 2022-02-01 ENCOUNTER — Encounter: Payer: Self-pay | Admitting: "Endocrinology

## 2022-02-01 ENCOUNTER — Ambulatory Visit (INDEPENDENT_AMBULATORY_CARE_PROVIDER_SITE_OTHER): Payer: Commercial Managed Care - PPO | Admitting: "Endocrinology

## 2022-02-01 VITALS — BP 110/66 | HR 68 | Ht 65.5 in | Wt 166.2 lb

## 2022-02-01 DIAGNOSIS — E782 Mixed hyperlipidemia: Secondary | ICD-10-CM

## 2022-02-01 DIAGNOSIS — E042 Nontoxic multinodular goiter: Secondary | ICD-10-CM

## 2022-02-01 DIAGNOSIS — Z5320 Procedure and treatment not carried out because of patient's decision for unspecified reasons: Secondary | ICD-10-CM | POA: Insufficient documentation

## 2022-02-01 NOTE — Progress Notes (Signed)
02/01/2022, 6:22 PM  Endocrinology follow-up note   Subjective:    Patient ID: Madison Reyes, female    DOB: 26-Jan-1961, PCP Kathyrn Drown, MD   Past Medical History:  Diagnosis Date   Anxiety    Fibroids    h/o   H/O dysmenorrhea    H/O menorrhagia    High cholesterol    Menopausal symptoms    h/o   Migraine    Past Surgical History:  Procedure Laterality Date   ABDOMINAL HYSTERECTOMY     BACK SURGERY  2015   disc removal   LIPOMA EXCISION     MYOMECTOMY     ovarian fibroid     TOTAL HIP ARTHROPLASTY Left 01/2021   UTERINE FIBROID SURGERY     Social History   Socioeconomic History   Marital status: Married    Spouse name: Not on file   Number of children: 1   Years of education: Not on file   Highest education level: Bachelor's degree (e.g., BA, AB, BS)  Occupational History    Employer: [employer]  Tobacco Use   Smoking status: Never   Smokeless tobacco: Never  Substance and Sexual Activity   Alcohol use: Never   Drug use: Never   Sexual activity: Yes    Birth control/protection: None    Comment: no cycles; pt had hysterectomy  Other Topics Concern   Not on file  Social History Narrative   Lives at home with husband Ricky   Caffeine: none   Social Determinants of Radio broadcast assistant Strain: Not on file  Food Insecurity: Not on file  Transportation Needs: Not on file  Physical Activity: Not on file  Stress: Not on file  Social Connections: Not on file   Family History  Problem Relation Age of Onset   Stroke Paternal Grandfather    Pneumonia Paternal Grandmother    Cancer Father        bone marrow cancer   Prostate cancer Father    COPD Father    Heart disease Father    Dementia Mother    Hypertension Mother    Heart disease Mother    Cancer Sister        uterine   Breast cancer Neg Hx    Outpatient Encounter Medications as of 02/01/2022  Medication  Sig   buPROPion (WELLBUTRIN XL) 300 MG 24 hr tablet TAKE 1 TABLET BY MOUTH ONCE DAILY.   CALCIUM PO Take by mouth.   Cholecalciferol (VITAMIN D3) 50 MCG (2000 UT) TABS Take by mouth.   escitalopram (LEXAPRO) 10 MG tablet TAKE ONE TABLET BY MOUTH ONCE DAILY.   Multiple Vitamins-Minerals (MULTIVITAMIN PO) Take by mouth.   vitamin B-12 (CYANOCOBALAMIN) 100 MCG tablet Take 100 mcg by mouth daily.   vitamin k 100 MCG tablet Take 100 mcg by mouth daily.   [DISCONTINUED] cetirizine (ZYRTEC) 10 MG tablet Take 10 mg by mouth daily.   No facility-administered encounter medications on file as of 02/01/2022.   ALLERGIES: Allergies  Allergen Reactions   Aspirin     "stomach sensitivity"    VACCINATION STATUS:  There is no immunization history on file for this patient.  HPI Madison Reyes  is 61 y.o. female who presents today with a medical history as above. she is being seen in follow-up after she was seen in consultation for hypothyroidism requested by Kathyrn Drown, MD.   Since her last visit in June 2021, she stayed off of any thyroid hormone supplements.  Previsit thyroid function test are consistent with euthyroid state.      She was treated with low-dose Armour Thyroid previously.    -She denies palpitations, tremors, heat/cold intolerance. -She did not have acute complaints today.  She lost 5 pounds intentionally.  She reports fluctuating body weight.  She has family history of hypothyroidism in one of her siblings, goiter which required surgery in her mother.  She denies any family history of thyroid malignancy.  She has severe dyslipidemia, not on treatment.  Review of Systems  Constitutional: + Steady weight, + fatigue, no subjective hyperthermia, no subjective hypothermia   Objective:       02/01/2022    3:20 PM 10/23/2021    9:46 AM 01/26/2021   11:11 AM  Vitals with BMI  Height 5' 5.5" 5' 5.5" 5' 5.5"  Weight 166 lbs 3 oz 159 lbs 156 lbs 3 oz  BMI 27.23 25.42 70.62   Systolic 376 283 151  Diastolic 66 82 78  Pulse 68 84 76    BP 110/66   Pulse 68   Ht 5' 5.5" (1.664 m)   Wt 166 lb 3.2 oz (75.4 kg)   BMI 27.24 kg/m   Wt Readings from Last 3 Encounters:  02/01/22 166 lb 3.2 oz (75.4 kg)  10/23/21 159 lb (72.1 kg)  01/26/21 156 lb 3.2 oz (70.9 kg)    Physical Exam   Constitutional:  Body mass index is 27.24 kg/m.,  not in acute distress, normal state of mind Eyes: PERRLA, EOMI, no exophthalmos ENT: moist mucous membranes, + gross thyromegaly, no gross cervical lymphadenopathy  Thyroid ultrasound from Jan 06, 2021: Right lobe 5.2 cm, left lobe 5.1 cm.  1.2 cm nodule in the left mid lobe.  No suspicious features, meets criteria for continued surveillance.  CMP ( most recent) CMP     Component Value Date/Time   NA 144 10/19/2021 0934   K 4.2 10/19/2021 0934   CL 107 (H) 10/19/2021 0934   CO2 23 10/19/2021 0934   GLUCOSE 82 10/19/2021 0934   GLUCOSE 88 04/03/2013 0740   BUN 12 10/19/2021 0934   CREATININE 0.84 10/19/2021 0934   CREATININE 0.85 04/03/2013 0740   CALCIUM 9.5 10/19/2021 0934   PROT 6.8 10/19/2021 0934   ALBUMIN 4.4 10/19/2021 0934   AST 22 10/19/2021 0934   ALT 22 10/19/2021 0934   ALKPHOS 47 10/19/2021 0934   BILITOT 0.4 10/19/2021 0934   GFRNONAA 82 05/23/2020 0852   GFRAA 94 05/23/2020 0852     Diabetic Labs (most recent): Lab Results  Component Value Date   HGBA1C 5.1 01/22/2021   HGBA1C 5.1% 11/19/2017   HGBA1C 5.0 11/05/2017     Lipid Panel ( most recent) Lipid Panel     Component Value Date/Time   CHOL 283 (H) 10/19/2021 0934   TRIG 202 (H) 10/19/2021 0934   HDL 60 10/19/2021 0934   CHOLHDL 4.7 (H) 10/19/2021 0934   CHOLHDL 4.3 04/03/2013 0740   VLDL 24 04/03/2013 0740   LDLCALC 185 (H) 10/19/2021 0934   LABVLDL 38 10/19/2021 0934     Recent Results (from the past 2160 hour(s))  T4, Free     Status: None  Collection Time: 01/25/22  2:32 PM  Result Value Ref Range   Free T4 0.94 0.82 -  1.77 ng/dL  T3, Free     Status: None   Collection Time: 01/25/22  2:32 PM  Result Value Ref Range   T3, Free 2.4 2.0 - 4.4 pg/mL  TSH     Status: None   Collection Time: 01/25/22  2:32 PM  Result Value Ref Range   TSH 0.906 0.450 - 4.500 uIU/mL    Jan 11, 2020 thyroid ultrasound: Right lobe measured 5.6 cm x 1.6 cm x 1.4 cm with no nodules Left lobe measures 6.4 cm x 1.9 cm x 1.9 cm with 1 cm nonsuspicious nodule in the mid section.  Assessment & Plan:   1.  Nodular goiter  2.  Dyslipidemia 3.  Vitamin D deficiency: She is advised to continue vitamin D3 2000 units p.o. daily. -I discussed her recent labs and thyroid ultrasound findings with her.  Her presentation is consistent with euthyroid state.  Will not need intervention for thyroid hormone or antithyroid medications.    Repeat thyroid ultrasound to monitor the previously documented small nonsuspicious thyroid nodule measuring one-point centimeter on the left lobe.  Has gained 6 pounds since last visit, she is not a candidate for any major weight loss. In light of her severe dyslipidemia and her desire to avoid statins, she is a good candidate for lifestyle medicine. Dyslipidemia process high risk for cardiovascular disease for her.  This concern was discussed in detail with her. - she acknowledges that there is a room for improvement in her food and drink choices. - Suggestion is made for her to avoid simple carbohydrates  from her diet including Cakes, Sweet Desserts, Ice Cream, Soda (diet and regular), Sweet Tea, Candies, Chips, Cookies, Store Bought Juices, Alcohol , Artificial Sweeteners,  Coffee Creamer, and "Sugar-free" Products, Lemonade. This will help patient to have more stable blood glucose profile and potentially avoid unintended weight gain.  The following Lifestyle Medicine recommendations according to [facility]) were discussed and and offered to patient and she  agrees to start  the journey:  A. Whole Foods, Plant-Based Nutrition comprising of fruits and vegetables, plant-based proteins, whole-grain carbohydrates was discussed in detail with the patient.   A list for source of those nutrients were also provided to the patient.  Patient will use only water or unsweetened tea for hydration. B.  The need to stay away from risky substances including alcohol, smoking; obtaining 7 to 9 hours of restorative sleep, at least 150 minutes of moderate intensity exercise weekly, the importance of healthy social connections,  and stress management techniques were discussed. C.  A full color page of  Calorie density of various food groups per pound showing examples of each food groups was provided to the patient.   - I did not initiate any new prescriptions today. - she is advised to maintain close follow up with Kathyrn Drown, MD for primary care needs.   I spent 30 minutes in the care of the patient today including review of labs from Thyroid Function, CMP, and other relevant labs ; imaging/biopsy records (current and previous including abstractions from other facilities); face-to-face time discussing  her lab results and symptoms, medications doses, her options of short and long term treatment based on the latest standards of care / guidelines;   and documenting the encounter.  Madison Reyes  participated in the discussions, expressed understanding, and voiced agreement  with the above plans.  All questions were answered to her satisfaction. she is encouraged to contact clinic should she have any questions or concerns prior to her return visit.   Follow up plan: Return in about 6 months (around 08/03/2022) for F/U with Pre-visit Labs, Thyroid / Neck Ultrasound.   Glade Lloyd, MD [facility] [address] [place], [place] [address] Phone: [phone]  Fax: [phone]     02/01/2022, 6:22 PM  This note was partially  dictated with voice recognition software. Similar sounding words can be transcribed inadequately or may not  be corrected upon review.

## 2022-02-01 NOTE — Patient Instructions (Signed)

## 2022-03-01 ENCOUNTER — Encounter: Payer: Self-pay | Admitting: Family Medicine

## 2022-03-08 LAB — LIPID PANEL
Cholesterol: 265 — AB (ref 0–200)
HDL: 70 (ref 35–70)
LDL Cholesterol: 170
Triglycerides: 124 (ref 40–160)

## 2022-03-08 LAB — TSH: TSH: 0.92 (ref 0.41–5.90)

## 2022-03-16 ENCOUNTER — Telehealth: Payer: Self-pay | Admitting: "Endocrinology

## 2022-03-16 NOTE — Telephone Encounter (Signed)
Pt is having some labs sent over - be on the look out - She wants Dr Dorris Fetch to review those

## 2022-03-17 NOTE — Telephone Encounter (Signed)
Noted  

## 2022-03-22 ENCOUNTER — Telehealth: Payer: Self-pay | Admitting: "Endocrinology

## 2022-03-22 NOTE — Telephone Encounter (Signed)
Received medical record request from [place] to [employer] to release records and scanned authorization in chart

## 2022-04-13 NOTE — Telephone Encounter (Signed)
Labs received

## 2022-04-13 NOTE — Telephone Encounter (Signed)
Noted  

## 2022-04-13 NOTE — Telephone Encounter (Signed)
Pt called to see if we had these labs yet, made her aware we do not. She will call them again

## 2022-04-17 ENCOUNTER — Other Ambulatory Visit: Payer: Self-pay | Admitting: Nurse Practitioner

## 2022-04-19 ENCOUNTER — Telehealth: Payer: Self-pay | Admitting: "Endocrinology

## 2022-04-19 NOTE — Telephone Encounter (Signed)
Pt called and said to please review her recent thyroid labs in chart because she has been having extreme hot flashes. Please advise

## 2022-04-19 NOTE — Telephone Encounter (Signed)
She needs to schedule follow-up visit for this fall may have a 90-day refill

## 2022-04-19 NOTE — Telephone Encounter (Signed)
Left a message requesting pt return call to the office. ?

## 2022-04-20 NOTE — Telephone Encounter (Signed)
Left a message requesting pt return call to the office. ?

## 2022-05-06 ENCOUNTER — Encounter: Payer: Self-pay | Admitting: Family Medicine

## 2022-05-06 NOTE — Telephone Encounter (Signed)
Nurses Certainly what Dr. Dorris Fetch proposes plant-based diet is healthy For some people that works really well for other people it is difficult to follow As for 900 cal/day that seems on the low side I do not recommend phenteramine or other measures currently other than healthy diet regular exercise If Madison Reyes would like to follow-up to discuss in detail that would be fine

## 2022-06-14 ENCOUNTER — Telehealth: Payer: Self-pay

## 2022-06-14 DIAGNOSIS — E785 Hyperlipidemia, unspecified: Secondary | ICD-10-CM

## 2022-06-14 NOTE — Telephone Encounter (Signed)
Last OV 10/23/21 patient is requesting cholesterol levels checked.

## 2022-06-14 NOTE — Telephone Encounter (Signed)
Caller name:Tishanna Waage   On DPR? :Yes  Call back [place]  Provider they see: [place]   Reason for call:Pt is calling wants to have blood work ordered to check her cholesterol rechecked

## 2022-06-14 NOTE — Telephone Encounter (Signed)
Lipid profile due to hyperlipidemia

## 2022-06-15 NOTE — Telephone Encounter (Signed)
Blood work ordered in Epic. Left message to return call 

## 2022-06-22 NOTE — Telephone Encounter (Signed)
Left message to return call 

## 2022-06-23 NOTE — Telephone Encounter (Signed)
Patient notified

## 2022-06-26 LAB — LIPID PANEL
Chol/HDL Ratio: 4.1 ratio (ref 0.0–4.4)
Cholesterol, Total: 266 mg/dL — ABNORMAL HIGH (ref 100–199)
HDL: 65 mg/dL (ref >39)
LDL Chol Calc (NIH): 181 mg/dL — ABNORMAL HIGH (ref 0–99)
Triglycerides: 114 mg/dL (ref 0–149)
VLDL Cholesterol Cal: 20 mg/dL (ref 5–40)

## 2022-07-09 ENCOUNTER — Ambulatory Visit (INDEPENDENT_AMBULATORY_CARE_PROVIDER_SITE_OTHER): Payer: Commercial Managed Care - PPO | Admitting: Nurse Practitioner

## 2022-07-09 VITALS — BP 144/79 | HR 70 | Temp 98.7°F | Ht 65.5 in | Wt 164.6 lb

## 2022-07-09 DIAGNOSIS — Z13820 Encounter for screening for osteoporosis: Secondary | ICD-10-CM

## 2022-07-09 DIAGNOSIS — E785 Hyperlipidemia, unspecified: Secondary | ICD-10-CM

## 2022-07-09 DIAGNOSIS — Z79899 Other long term (current) drug therapy: Secondary | ICD-10-CM | POA: Diagnosis not present

## 2022-07-09 DIAGNOSIS — M2550 Pain in unspecified joint: Secondary | ICD-10-CM

## 2022-07-09 DIAGNOSIS — R5383 Other fatigue: Secondary | ICD-10-CM | POA: Diagnosis not present

## 2022-07-09 DIAGNOSIS — Z780 Asymptomatic menopausal state: Secondary | ICD-10-CM

## 2022-07-09 NOTE — Patient Instructions (Signed)
Fibromyalgia

## 2022-07-09 NOTE — Progress Notes (Unsigned)
Subjective:    Patient ID: Madison Reyes, female    DOB: 04-08-1961, 61 y.o.   MRN: [id number]  HPI Presents for complaints of fatigue and migratory joint/muscle pain worse over the past 6 months.  Describes as "severe" at times.  Has experienced excessive fatigue.  Wants to exercise but does not feel the energy to do so.  No fever.  No rash.  No erythema or swelling in the joints.  States Celebrex helps some but upsets her stomach.  Had COVID for the first time in October, no change in her symptomatology.  Takes daily calcium and vitamin D.  States her mother had osteoporosis.  Has not had a DEXA scan in about 4 years. Has a history of significant arthritis, has had a total left hip arthroplasty.  Also had lumbar laminectomy in 2015.  States her right hip also needs replacement.  Also having significant issues with both of her knees.  Has caused difficulty with some movements. Sees local endocrinologist for her thyroid issues.  Next point in December. Also wants to discuss her recent cholesterol test. Denies any suicidal or homicidal thoughts or ideation.  Denies any self-harm behaviors.    07/09/2022    1:55 PM  Depression screen PHQ 2/9  Decreased Interest 1  Down, Depressed, Hopeless 1  PHQ - 2 Score 2  Altered sleeping 1  Tired, decreased energy 1  Change in appetite 1  Feeling bad or failure about yourself  1  Trouble concentrating 1  Moving slowly or fidgety/restless 1  Suicidal thoughts 1  PHQ-9 Score 9  Difficult doing work/chores Somewhat difficult      07/09/2022    1:56 PM 10/23/2021   12:22 PM 05/09/2019    5:09 PM 11/01/2017   10:14 AM  GAD 7 : Generalized Anxiety Score  Nervous, Anxious, on Edge '1 3 1 3  '$ Control/stop worrying '1 3 2 3  '$ Worry too much - different things '1 3 2 3  '$ Trouble relaxing '1 3 1 3  '$ Restless 1 3 0 0  Easily annoyed or irritable '1 3 1 3  '$ Afraid - awful might happen '1 2 1 3  '$ Total GAD 7 Score '7 20 8 18  '$ Anxiety Difficulty Somewhat difficult   Not difficult at all Somewhat difficult    Review of Systems  Constitutional:  Positive for fatigue. Negative for fever.  HENT:  Negative for sore throat and trouble swallowing.   Respiratory:  Negative for cough, chest tightness and shortness of breath.   Cardiovascular:  Negative for chest pain.  Musculoskeletal:  Positive for arthralgias and myalgias.  Skin:  Negative for color change and rash.  Psychiatric/Behavioral:  Positive for sleep disturbance. Negative for self-injury and suicidal ideas.        Objective:   Physical Exam NAD.  Alert, oriented.  Calm affect.  Making good eye contact.  Dressed appropriately for the day.  Thoughts logical coherent and relevant.  Speech clear. Lungs clear.  Heart regular rate rhythm.  Hands and fingers: Enlargement of several PIP joints.  Minimal nodularity.  No erythema warmth or edema.  Gets on and off exam table without difficulty.  Can do a partial squat but with difficulty. Today's Vitals   07/09/22 1350  BP: (!) 144/79  Pulse: 70  Temp: 98.7 F (37.1 C)  TempSrc: Oral  SpO2: 100%  Weight: 164 lb 9.6 oz (74.7 kg)  Height: 5' 5.5" (1.664 m)   Body mass index is 26.97 kg/m.  Lipid  profile dated 06/25/2022 total cholesterol 266 with LDL 181.  The elevated LDL has been consistent over the past 2 years.       Assessment & Plan:   Problem List Items Addressed This Visit       Other   Fatigue   Relevant Orders   Rheumatoid factor (Completed)   CBC with Differential/Platelet (Completed)   Anti-CCP Ab, IgG + IgA (RDL) (Completed)   ANA (Completed)   Sedimentation Rate (Completed)   Hyperlipidemia   Relevant Medications   rosuvastatin (CRESTOR) 10 MG tablet   Other Relevant Orders   Lipid panel   Other Visit Diagnoses     Arthralgia of multiple sites, bilateral    -  Primary   Relevant Orders   Rheumatoid factor (Completed)   CBC with Differential/Platelet (Completed)   Anti-CCP Ab, IgG + IgA (RDL) (Completed)   ANA  (Completed)   Sedimentation Rate (Completed)   High risk medication use       Relevant Orders   Hepatic function panel   Screening for osteoporosis       Relevant Orders   DG Bone Density   Postmenopausal       Relevant Orders   DG Bone Density      Meds ordered this encounter  Medications   rosuvastatin (CRESTOR) 10 MG tablet    Sig: Take 1 tablet (10 mg total) by mouth daily.    Dispense:  90 tablet    Refill:  0    Order Specific Question:   Supervising Provider    Answer:   Sallee Lange A [[id number]]   Screening labs pending for myalgias/arthralgias.  Also explained to patient she has extensive severe arthritis which is adding to her symptoms.  Plan to rule out RA and lupus. Bone density scheduled. Agrees to start statin for her cholesterol.  Reviewed potential adverse effects.  Discontinue medication and contact office if any problems.  Otherwise repeat lipid and liver profiles in about 8 to 10 weeks. Follow-up with endocrinology as planned. Discussed the relationship of her mental health with her physical health.  States her husband has advised her to work from home or discontinue working but at this time patient advised to continue part-time work since this provides socialization and gives her motivation to get out of the house.  Discussed possible diagnosis of fibromyalgia. Follow-up based on lab reports.

## 2022-07-10 ENCOUNTER — Encounter: Payer: Self-pay | Admitting: Nurse Practitioner

## 2022-07-10 MED ORDER — ROSUVASTATIN CALCIUM 10 MG PO TABS: 10.0000 mg | ORAL_TABLET | Freq: Every day | ORAL | 0 refills | Status: DC

## 2022-07-14 LAB — CBC WITH DIFFERENTIAL/PLATELET
Basophils Absolute: 0.1 10*3/uL (ref 0.0–0.2)
Basos: 1 %
EOS (ABSOLUTE): 0.1 10*3/uL (ref 0.0–0.4)
Eos: 2 %
Hematocrit: 39.9 % (ref 34.0–46.6)
Hemoglobin: 13.5 g/dL (ref 11.1–15.9)
Immature Grans (Abs): 0 10*3/uL (ref 0.0–0.1)
Immature Granulocytes: 0 %
Lymphocytes Absolute: 2.4 10*3/uL (ref 0.7–3.1)
Lymphs: 41 %
MCH: 32.7 pg (ref 26.6–33.0)
MCHC: 33.8 g/dL (ref 31.5–35.7)
MCV: 97 fL (ref 79–97)
Monocytes Absolute: 0.5 10*3/uL (ref 0.1–0.9)
Monocytes: 8 %
Neutrophils Absolute: 2.8 10*3/uL (ref 1.4–7.0)
Neutrophils: 48 %
Platelets: 246 10*3/uL (ref 150–450)
RBC: 4.13 x10E6/uL (ref 3.77–5.28)
RDW: 12.8 % (ref 11.7–15.4)
WBC: 5.9 10*3/uL (ref 3.4–10.8)

## 2022-07-14 LAB — RHEUMATOID FACTOR: Rheumatoid fact SerPl-aCnc: 10.1 [IU]/mL

## 2022-07-14 LAB — ANTI-CCP AB, IGG + IGA (RDL): Anti-CCP Ab, IgG + IgA (RDL): <20 Units (ref <20)

## 2022-07-14 LAB — SEDIMENTATION RATE: Sed Rate: 21 mm/hr (ref 0–40)

## 2022-07-14 LAB — ANA: Anti Nuclear Antibody (ANA): NEGATIVE

## 2022-07-20 ENCOUNTER — Other Ambulatory Visit: Payer: Self-pay | Admitting: Nurse Practitioner

## 2022-07-20 ENCOUNTER — Other Ambulatory Visit: Payer: Self-pay | Admitting: Family Medicine

## 2022-07-26 ENCOUNTER — Telehealth: Payer: Self-pay | Admitting: "Endocrinology

## 2022-07-26 NOTE — Telephone Encounter (Signed)
Pt needs an ultrasound of her thyroid and said nobody has contacted her. Her appt is 2/12. Please advise.

## 2022-07-27 NOTE — Telephone Encounter (Signed)
Sent order back to central sched

## 2022-07-29 ENCOUNTER — Telehealth: Payer: Self-pay

## 2022-07-29 LAB — LIPID PANEL
Chol/HDL Ratio: 3.9 ratio (ref 0.0–4.4)
Cholesterol, Total: 222 mg/dL — ABNORMAL HIGH (ref 100–199)
HDL: 57 mg/dL (ref >39)
LDL Chol Calc (NIH): 147 mg/dL — ABNORMAL HIGH (ref 0–99)
Triglycerides: 103 mg/dL (ref 0–149)
VLDL Cholesterol Cal: 18 mg/dL (ref 5–40)

## 2022-07-29 LAB — TSH: TSH: 1.08 u[IU]/mL (ref 0.450–4.500)

## 2022-07-29 LAB — T4, FREE: Free T4: 0.93 ng/dL (ref 0.82–1.77)

## 2022-07-29 NOTE — Telephone Encounter (Signed)
Left a message requesting pt return call to the office. ?

## 2022-07-30 ENCOUNTER — Ambulatory Visit (HOSPITAL_BASED_OUTPATIENT_CLINIC_OR_DEPARTMENT_OTHER)
Admission: RE | Admit: 2022-07-30 | Discharge: 2022-07-30 | Disposition: A | Discharge status: Home / Self Care | Payer: Commercial Managed Care - PPO | Source: Ambulatory Visit | Attending: "Endocrinology | Admitting: "Endocrinology

## 2022-07-30 DIAGNOSIS — E042 Nontoxic multinodular goiter: Secondary | ICD-10-CM | POA: Insufficient documentation

## 2022-08-03 ENCOUNTER — Ambulatory Visit (INDEPENDENT_AMBULATORY_CARE_PROVIDER_SITE_OTHER): Payer: Commercial Managed Care - PPO | Admitting: "Endocrinology

## 2022-08-03 ENCOUNTER — Encounter: Payer: Self-pay | Admitting: "Endocrinology

## 2022-08-03 VITALS — BP 126/78 | HR 72 | Ht 65.5 in

## 2022-08-03 DIAGNOSIS — E042 Nontoxic multinodular goiter: Secondary | ICD-10-CM | POA: Diagnosis not present

## 2022-08-03 DIAGNOSIS — E782 Mixed hyperlipidemia: Secondary | ICD-10-CM

## 2022-08-03 NOTE — Patient Instructions (Signed)
                                     Advice for Weight Management  -For most of us the best way to lose weight is by diet management. Generally speaking, diet management means consuming less calories intentionally which over time brings about progressive weight loss.  This can be achieved more effectively by avoiding ultra processed carbohydrates, processed meats, unhealthy fats.    It is critically important to know your numbers: how much calorie you are consuming and how much calorie you need. More importantly, our carbohydrates sources should be unprocessed naturally occurring  complex starch food items.  It is always important to balance nutrition also by  appropriate intake of proteins (mainly plant-based), healthy fats/oils, plenty of fruits and vegetables.   -The American College of Lifestyle Medicine (ACL M) recommends nutrition derived mostly from Whole Food, Plant Predominant Sources example an apple instead of applesauce or apple pie. Eat Plenty of vegetables, Mushrooms, fruits, Legumes, Whole Grains, Nuts, seeds in lieu of processed meats, processed snacks/pastries red meat, poultry, eggs.  Use only water or unsweetened tea for hydration.  The College also recommends the need to stay away from risky substances including alcohol, smoking; obtaining 7-9 hours of restorative sleep, at least 150 minutes of moderate intensity exercise weekly, importance of healthy social connections, and being mindful of stress and seek help when it is overwhelming.    -Sticking to a routine mealtime to eat 3 meals a day and avoiding unnecessary snacks is shown to have a big role in weight control. Under normal circumstances, the only time we burn stored energy is when we are hungry, so allow  some hunger to take place- hunger means no food between appropriate meal times, only water.  It is not advisable to starve.   -It is better to avoid simple carbohydrates including:  Cakes, Sweet Desserts, Ice Cream, Soda (diet and regular), Sweet Tea, Candies, Chips, Cookies, Store Bought Juices, Alcohol in Excess of  1-2 drinks a day, Lemonade,  Artificial Sweeteners, Doughnuts, Coffee Creamers, "Sugar-free" Products, etc, etc.  This is not a complete list.....    -Consulting with certified diabetes educators is proven to provide you with the most accurate and current information on diet.  Also, you may be  interested in discussing diet options/exchanges , we can schedule a visit with Madison Reyes, RDN, CDE for individualized nutrition education.  -Exercise: If you are able: 30 -60 minutes a day ,4 days a week, or 150 minutes of moderate intensity exercise weekly.    The longer the better if tolerated.  Combine stretch, strength, and aerobic activities.  If you were told in the past that you have high risk for cardiovascular diseases, or if you are currently symptomatic, you may seek evaluation by your heart doctor prior to initiating moderate to intense exercise programs.                                  Additional Care Considerations for Diabetes/Prediabetes   -Diabetes  is a chronic disease.  The most important care consideration is regular follow-up with your diabetes care provider with the goal being avoiding or delaying its complications and to take advantage of advances in medications and technology.  If appropriate actions are taken early enough, type 2 diabetes can even be   reversed.  Seek information from the right source.  - Whole Food, Plant Predominant Nutrition is highly recommended: Eat Plenty of vegetables, Mushrooms, fruits, Legumes, Whole Grains, Nuts, seeds in lieu of processed meats, processed snacks/pastries red meat, poultry, eggs as recommended by American College of  Lifestyle Medicine (ACLM).  -Type 2 diabetes is known to coexist with other important comorbidities such as high blood pressure and high cholesterol.  It is critical to control not only the  diabetes but also the high blood pressure and high cholesterol to minimize and delay the risk of complications including coronary artery disease, stroke, amputations, blindness, etc.  The good news is that this diet recommendation for type 2 diabetes is also very helpful for managing high cholesterol and high blood blood pressure.  - Studies showed that people with diabetes will benefit from a class of medications known as ACE inhibitors and statins.  Unless there are specific reasons not to be on these medications, the standard of care is to consider getting one from these groups of medications at an optimal doses.  These medications are generally considered safe and proven to help protect the heart and the kidneys.    - People with diabetes are encouraged to initiate and maintain regular follow-up with eye doctors, foot doctors, dentists , and if necessary heart and kidney doctors.     - It is highly recommended that people with diabetes quit smoking or stay away from smoking, and get yearly  flu vaccine and pneumonia vaccine at least every 5 years.  See above for additional recommendations on exercise, sleep, stress management , and healthy social connections.      

## 2022-08-03 NOTE — Progress Notes (Signed)
08/03/2022, 4:14 PM  Endocrinology follow-up note   Subjective:    Patient ID: Madison Reyes, female    DOB: [birth year], PCP Kathyrn Drown, MD   Past Medical History:  Diagnosis Date   Anxiety    Fibroids    h/o   H/O dysmenorrhea    H/O menorrhagia    High cholesterol    Menopausal symptoms    h/o   Migraine    Past Surgical History:  Procedure Laterality Date   ABDOMINAL HYSTERECTOMY     BACK SURGERY  2015   disc removal   LIPOMA EXCISION     MYOMECTOMY     ovarian fibroid     TOTAL HIP ARTHROPLASTY Left 01/2021   UTERINE FIBROID SURGERY     Social History   Socioeconomic History   Marital status: Married    Spouse name: Not on file   Number of children: 1   Years of education: Not on file   Highest education level: Bachelor's degree (e.g., BA, AB, BS)  Occupational History    Employer: [employer]  Tobacco Use   Smoking status: Never   Smokeless tobacco: Never  Substance and Sexual Activity   Alcohol use: Never   Drug use: Never   Sexual activity: Yes    Birth control/protection: None    Comment: no cycles; pt had hysterectomy  Other Topics Concern   Not on file  Social History Narrative   Lives at home with husband Madison Reyes   Caffeine: none   Social Determinants of Radio broadcast assistant Strain: Not on file  Food Insecurity: Not on file  Transportation Needs: Not on file  Physical Activity: Not on file  Stress: Not on file  Social Connections: Not on file   Family History  Problem Relation Age of Onset   Stroke Paternal Grandfather    Pneumonia Paternal Grandmother    Cancer Father        bone marrow cancer   Prostate cancer Father    COPD Father    Heart disease Father    Dementia Mother    Hypertension Mother    Heart disease Mother    Cancer Sister        uterine   Breast cancer Neg Hx    Outpatient Encounter Medications as of 08/03/2022  Medication  Sig   buPROPion (WELLBUTRIN XL) 300 MG 24 hr tablet TAKE 1 TABLET BY MOUTH ONCE DAILY.   CALCIUM PO Take by mouth.   Cholecalciferol (VITAMIN D3) 50 MCG (2000 UT) TABS Take by mouth.   escitalopram (LEXAPRO) 10 MG tablet TAKE ONE TABLET BY MOUTH ONCE DAILY.   Multiple Vitamins-Minerals (MULTIVITAMIN PO) Take by mouth.   rosuvastatin (CRESTOR) 10 MG tablet Take 1 tablet (10 mg total) by mouth daily.   vitamin B-12 (CYANOCOBALAMIN) 100 MCG tablet Take 100 mcg by mouth daily.   vitamin k 100 MCG tablet Take 100 mcg by mouth daily.   No facility-administered encounter medications on file as of 08/03/2022.   ALLERGIES: Allergies  Allergen Reactions   Aspirin     "stomach sensitivity"    VACCINATION STATUS:  There is no immunization history on file for this patient.  HPI Madison  B Reyes is 61 y.o. female who presents today with a medical history as above. she is being seen in follow-up after she was seen in consultation for hypothyroidism requested by Kathyrn Drown, MD.   Since her last visit in June 2021, she stayed off of any thyroid hormone supplements. Her pre-visit thyroid function test are consistent with euthyroid state.      She was treated with low-dose Armour Thyroid previously.    -She denies palpitations, tremors, heat/cold intolerance. -She did not have acute complaints today.  She lost 5 pounds intentionally.  She reports fluctuating body weight.  She has family history of hypothyroidism in one of her siblings, goiter which required surgery in her mother.  She denies any family history of thyroid malignancy.  She has severe dyslipidemia, started Crestor 10 mg po qhs in the interim. She underwent thyroid ultrasound which is  unremarkable.   Review of Systems  Constitutional: + Steady weight, + fatigue, no subjective hyperthermia, no subjective hypothermia   Objective:       08/03/2022    2:41 PM 07/09/2022    1:50 PM 02/01/2022    3:20 PM  Vitals with BMI   Height 5' 5.5" 5' 5.5" 5' 5.5"  Weight  164 lbs 10 oz 166 lbs 3 oz  BMI  56.81 27.51  Systolic 700 174 944  Diastolic 78 79 66  Pulse 72 70 68    BP 126/78   Pulse 72   Ht 5' 5.5" (1.664 m)   BMI 26.97 kg/m   Wt Readings from Last 3 Encounters:  07/09/22 164 lb 9.6 oz (74.7 kg)  02/01/22 166 lb 3.2 oz (75.4 kg)  10/23/21 159 lb (72.1 kg)    Physical Exam   Constitutional:  Body mass index is 26.97 kg/m.,  not in acute distress, normal state of mind Eyes: PERRLA, EOMI, no exophthalmos ENT: moist mucous membranes, + gross thyromegaly, no gross cervical lymphadenopathy  Thyroid ultrasound from Jan 06, 2021: Right lobe 5.2 cm, left lobe 5.1 cm.  1.2 cm nodule in the left mid lobe.  No suspicious features, meets criteria for continued surveillance.  CMP ( most recent) CMP     Component Value Date/Time   NA 144 10/19/2021 0934   K 4.2 10/19/2021 0934   CL 107 (H) 10/19/2021 0934   CO2 23 10/19/2021 0934   GLUCOSE 82 10/19/2021 0934   GLUCOSE 88 04/03/2013 0740   BUN 12 10/19/2021 0934   CREATININE 0.84 10/19/2021 0934   CREATININE 0.85 04/03/2013 0740   CALCIUM 9.5 10/19/2021 0934   PROT 6.8 10/19/2021 0934   ALBUMIN 4.4 10/19/2021 0934   AST 22 10/19/2021 0934   ALT 22 10/19/2021 0934   ALKPHOS 47 10/19/2021 0934   BILITOT 0.4 10/19/2021 0934   GFRNONAA 82 05/23/2020 0852   GFRAA 94 05/23/2020 0852     Diabetic Labs (most recent): Lab Results  Component Value Date   HGBA1C 5.1 01/22/2021   HGBA1C 5.1% 11/19/2017   HGBA1C 5.0 11/05/2017     Lipid Panel ( most recent) Lipid Panel     Component Value Date/Time   CHOL 222 (H) 07/28/2022 1016   TRIG 103 07/28/2022 1016   HDL 57 07/28/2022 1016   CHOLHDL 3.9 07/28/2022 1016   CHOLHDL 4.3 04/03/2013 0740   VLDL 24 04/03/2013 0740   LDLCALC 147 (H) 07/28/2022 1016   LABVLDL 18 07/28/2022 1016     Recent Results (from the past 2160 hour(s))  Lipid panel  Status: Abnormal   Collection Time: 06/25/22  11:13 AM  Result Value Ref Range   Cholesterol, Total 266 (H) 100 - 199 mg/dL   Triglycerides 114 0 - 149 mg/dL   HDL 65 >39 mg/dL   VLDL Cholesterol Cal 20 5 - 40 mg/dL   LDL Chol Calc (NIH) 181 (H) 0 - 99 mg/dL   Chol/HDL Ratio 4.1 0.0 - 4.4 ratio    Comment:                                   T. Chol/HDL Ratio                                             Men  Women                               1/2 Avg.Risk  3.4    3.3                                   Avg.Risk  5.0    4.4                                2X Avg.Risk  9.6    7.1                                3X Avg.Risk 23.4   11.0   Rheumatoid factor     Status: None   Collection Time: 07/09/22  2:56 PM  Result Value Ref Range   Rhuematoid fact SerPl-aCnc 10.1 <14.0 IU/mL  CBC with Differential/Platelet     Status: None   Collection Time: 07/09/22  2:56 PM  Result Value Ref Range   WBC 5.9 3.4 - 10.8 x10E3/uL   RBC 4.13 3.77 - 5.28 x10E6/uL   Hemoglobin 13.5 11.1 - 15.9 g/dL   Hematocrit 39.9 34.0 - 46.6 %   MCV 97 79 - 97 fL   MCH 32.7 26.6 - 33.0 pg   MCHC 33.8 31.5 - 35.7 g/dL   RDW 12.8 11.7 - 15.4 %   Platelets 246 150 - 450 x10E3/uL   Neutrophils 48 Not Estab. %   Lymphs 41 Not Estab. %   Monocytes 8 Not Estab. %   Eos 2 Not Estab. %   Basos 1 Not Estab. %   Neutrophils Absolute 2.8 1.4 - 7.0 x10E3/uL   Lymphocytes Absolute 2.4 0.7 - 3.1 x10E3/uL   Monocytes Absolute 0.5 0.1 - 0.9 x10E3/uL   EOS (ABSOLUTE) 0.1 0.0 - 0.4 x10E3/uL   Basophils Absolute 0.1 0.0 - 0.2 x10E3/uL   Immature Granulocytes 0 Not Estab. %   Immature Grans (Abs) 0.0 0.0 - 0.1 x10E3/uL  Anti-CCP Ab, IgG + IgA (RDL)     Status: None   Collection Time: 07/09/22  2:56 PM  Result Value Ref Range   Anti-CCP Ab, IgG + IgA (RDL) <20 <20 Units    Comment:     Negative: <20             Weak Positive: 20-39  Moderate Positive: 40-59  Strong Positive: >59   ANA     Status: None   Collection Time: 07/09/22  2:56 PM  Result Value Ref Range   Anti  Nuclear Antibody (ANA) Negative Negative  Sedimentation Rate     Status: None   Collection Time: 07/09/22  2:56 PM  Result Value Ref Range   Sed Rate 21 0 - 40 mm/hr  Lipid panel     Status: Abnormal   Collection Time: 07/28/22 10:16 AM  Result Value Ref Range   Cholesterol, Total 222 (H) 100 - 199 mg/dL   Triglycerides 103 0 - 149 mg/dL   HDL 57 >39 mg/dL   VLDL Cholesterol Cal 18 5 - 40 mg/dL   LDL Chol Calc (NIH) 147 (H) 0 - 99 mg/dL   Chol/HDL Ratio 3.9 0.0 - 4.4 ratio    Comment:                                   T. Chol/HDL Ratio                                             Men  Women                               1/2 Avg.Risk  3.4    3.3                                   Avg.Risk  5.0    4.4                                2X Avg.Risk  9.6    7.1                                3X Avg.Risk 23.4   11.0   TSH     Status: None   Collection Time: 07/28/22 10:16 AM  Result Value Ref Range   TSH 1.080 0.450 - 4.500 uIU/mL  T4, free     Status: None   Collection Time: 07/28/22 10:16 AM  Result Value Ref Range   Free T4 0.93 0.82 - 1.77 ng/dL    Jan 11, 2020 thyroid ultrasound: Right lobe measured 5.6 cm x 1.6 cm x 1.4 cm with no nodules Left lobe measures 6.4 cm x 1.9 cm x 1.9 cm with 1 cm nonsuspicious nodule in the mid section.   Thyroid ultrasound on 07/30/2022  IMPRESSION: 1. Previously described left mid thyroid nodule appears spongiform on current examination and does not require further imaging follow-up. 2. Additional subcentimeter left mid thyroid nodule does not meet criteria for FNA or surveillance.  Assessment & Plan:   1.  Nodular goiter  2.  Dyslipidemia 3.  Vitamin D deficiency: She is advised to continue vitamin D3 2000 units p.o. daily. -I discussed her recent labs and thyroid ultrasound findings with her.  Her presentation is consistent with euthyroid state.  She will not need intervention for thyroid hormone or antithyroid medications.    Repeat  thyroid ultrasound to  monitor the previously documented small nonsuspicious thyroid nodule .  Has minimally fluctuating body weight , she is not a candidate for any major weight loss. In light of her severe dyslipidemia , she is advised to continue  Crestor '10mg'$  po qhs. - she acknowledges that there is a room for improvement in her food and drink choices. - Suggestion is made for her to avoid simple carbohydrates  from her diet including Cakes, Sweet Desserts, Ice Cream, Soda (diet and regular), Sweet Tea, Candies, Chips, Cookies, Store Bought Juices, Alcohol , Artificial Sweeteners,  Coffee Creamer, and "Sugar-free" Products, Lemonade. This will help patient to have more stable blood glucose profile and potentially avoid unintended weight gain.  The following Lifestyle Medicine recommendations according to [facility]) were discussed and and offered to patient and she  agrees to start the journey:  A. Whole Foods, Plant-Based Nutrition comprising of fruits and vegetables, plant-based proteins, whole-grain carbohydrates was discussed in detail with the patient.   A list for source of those nutrients were also provided to the patient.  Patient will use only water or unsweetened tea for hydration. B.  The need to stay away from risky substances including alcohol, smoking; obtaining 7 to 9 hours of restorative sleep, at least 150 minutes of moderate intensity exercise weekly, the importance of healthy social connections,  and stress management techniques were discussed. C.  A full color page of  Calorie density of various food groups per pound showing examples of each food groups was provided to the patient.  - I did not initiate any new prescriptions today. - she is advised to maintain close follow up with Kathyrn Drown, MD for primary care needs.  I spent 26 minutes in the care of the patient today including review of labs from Thyroid Function, CMP, and other  relevant labs ; imaging/biopsy records (current and previous including abstractions from other facilities); face-to-face time discussing  her lab results and symptoms, medications doses, her options of short and long term treatment based on the latest standards of care / guidelines;   and documenting the encounter.  Madison Reyes  participated in the discussions, expressed understanding, and voiced agreement with the above plans.  All questions were answered to her satisfaction. she is encouraged to contact clinic should she have any questions or concerns prior to her return visit.    Follow up plan: Return in about 1 year (around 08/04/2023) for Fasting Labs  in AM B4 8, A1c -NV.   Glade Lloyd, MD [facility] [address] [place], [place] [address] Phone: [phone]  Fax: [phone]     08/03/2022, 4:14 PM  This note was partially dictated with voice recognition software. Similar sounding words can be transcribed inadequately or may not  be corrected upon review.

## 2022-09-07 IMAGING — US US THYROID
1 series · 13 of 25 positions shown · non-contrast
Comparison: 01/11/2020

CLINICAL DATA: 60-year-old female with multinodular thyroid

EXAM:
THYROID ULTRASOUND
TECHNIQUE: Ultrasound examination of the thyroid gland and adjacent soft
tissues was performed.

[Series 1: us thyroid · 13 of 69 slices shown]
[im 1/69]
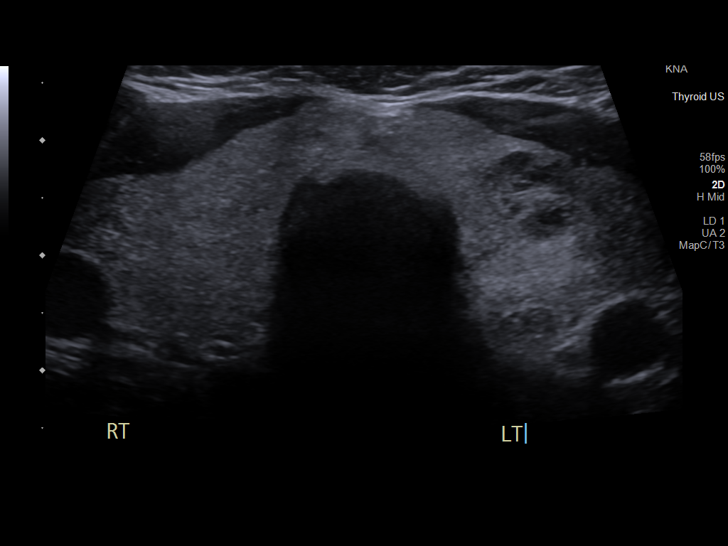
[im 6/69]
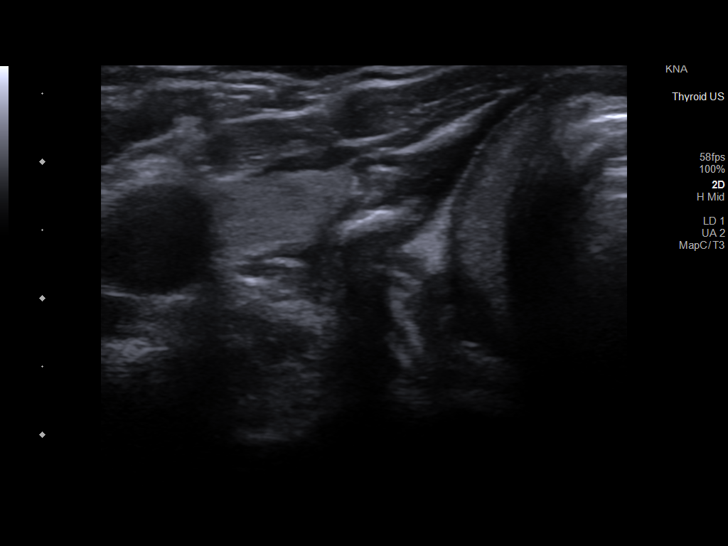
[im 12/69]
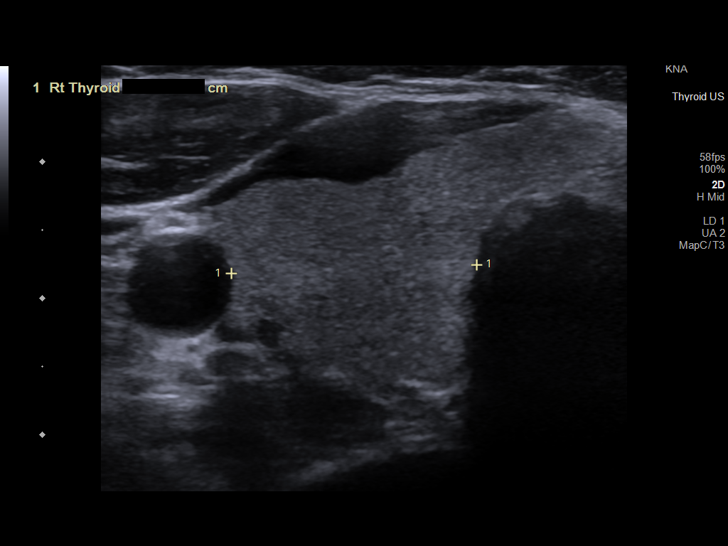
[im 18/69]
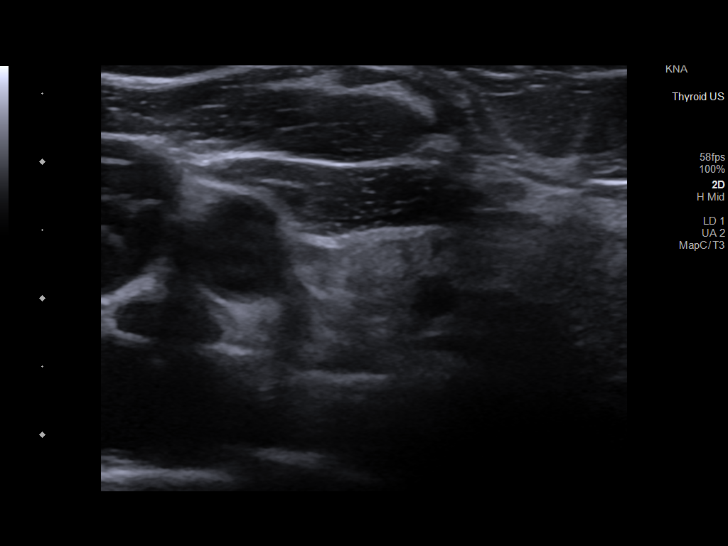
[im 23/69]
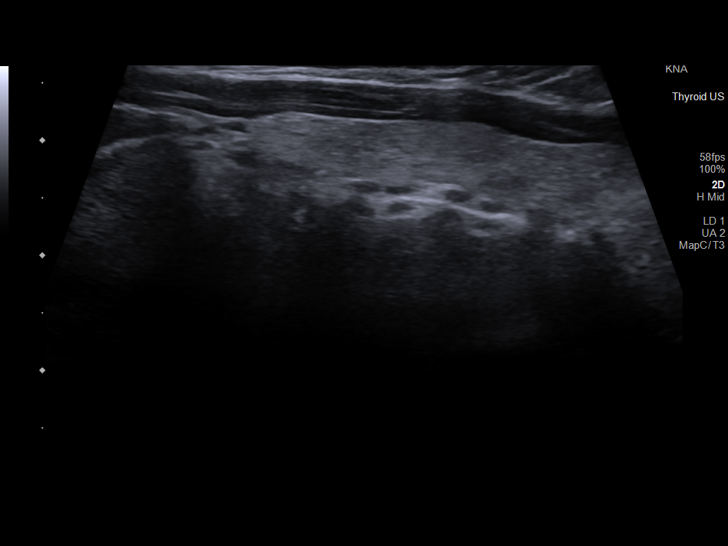
[im 29/69]
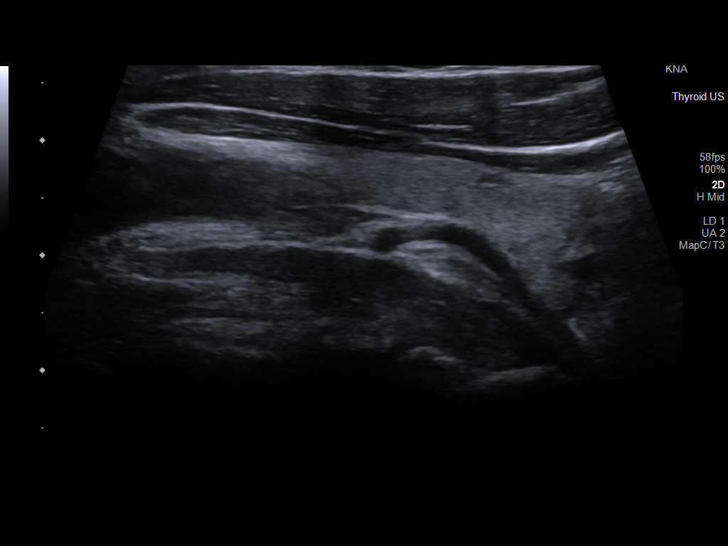
[im 35/69]
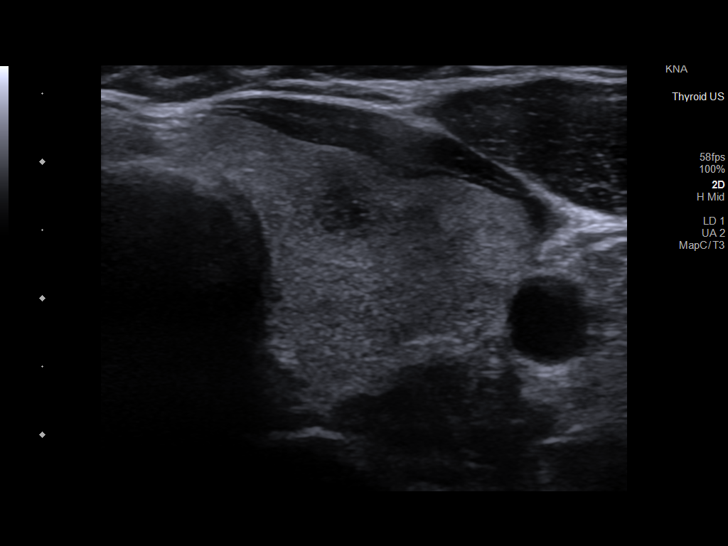
[im 40/69]
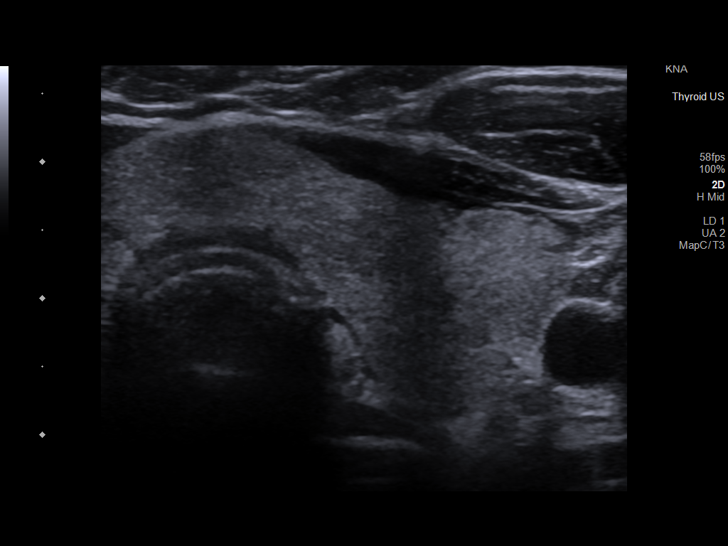
[im 46/69]
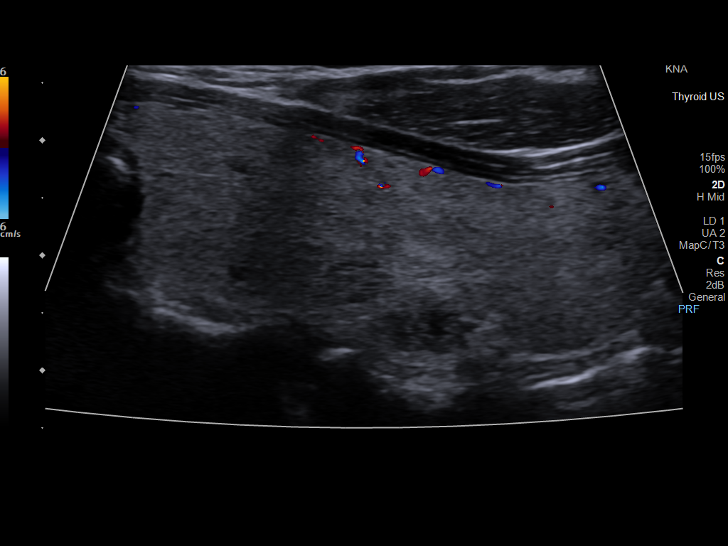
[im 52/69]
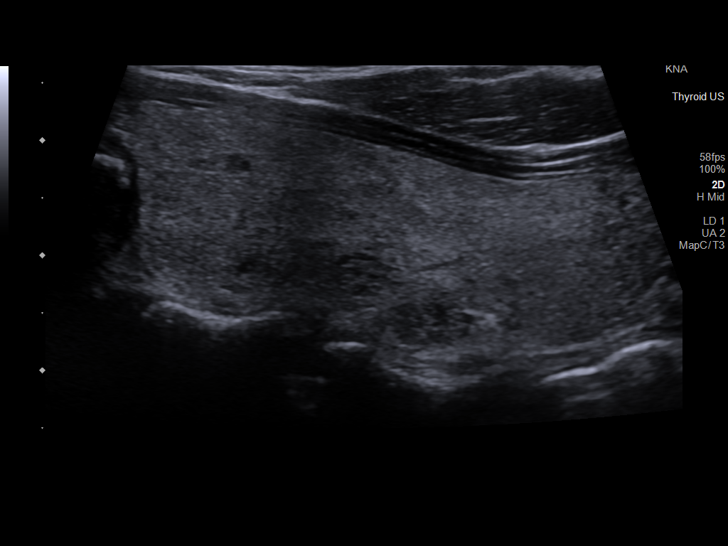
[im 57/69]
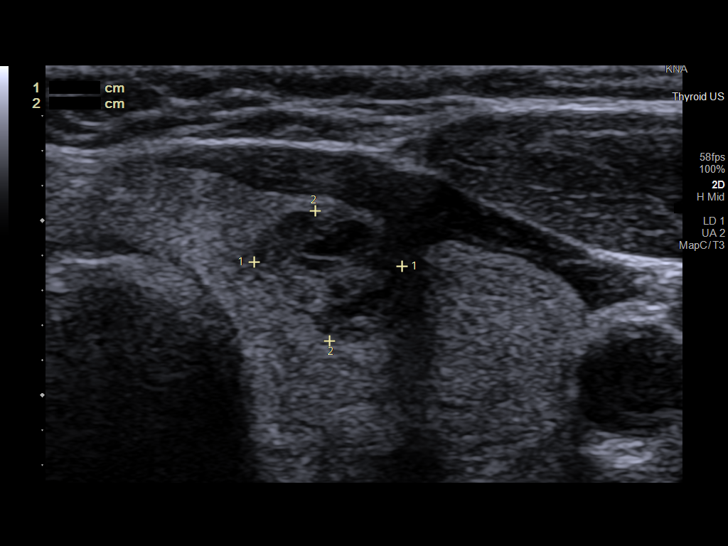
[im 63/69]
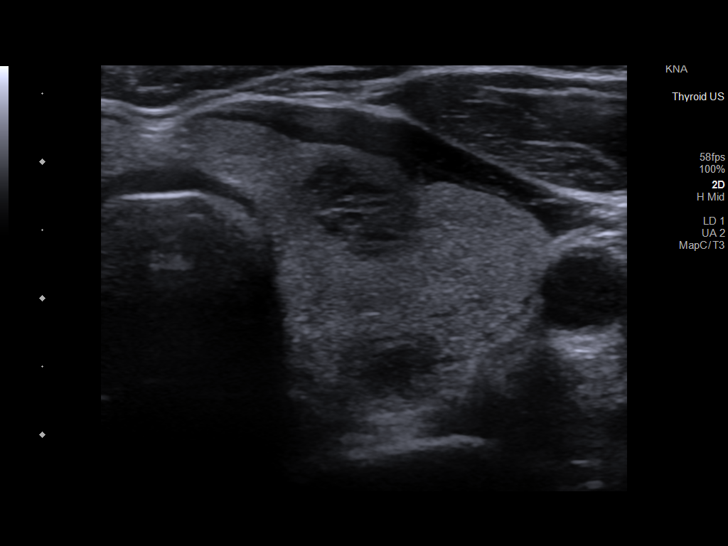
[im 69/69]
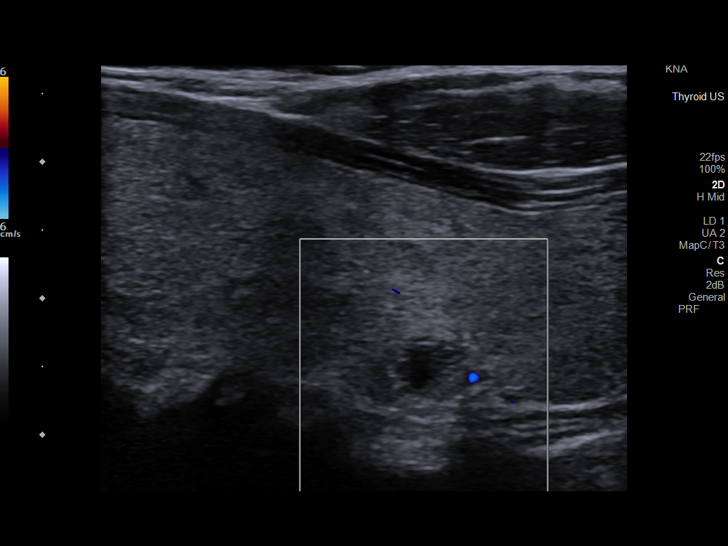

[13 of 25 positions shown; findings below may reference images not displayed]

FINDINGS: Parenchymal Echotexture: Mildly heterogenous

Isthmus: 0.6 cm

Right lobe: 5.2 cm x 1.4 cm x 1.8 cm

Left lobe: 5.1 cm x 1.8 cm x 2.0 cm

_________________________________________________________

Estimated total number of nodules >/= 1 cm: 1

Number of spongiform nodules >/=  2 cm not described below (TR1): 0

Number of mixed cystic and solid nodules >/= 1.5 cm not described
below (TR2): 0

_________________________________________________________

Nodule labeled 1 in the left mid thyroid, 1.2 cm, remains TR 4.
Nodule meets criteria for continued surveillance.

Nodule # 2:

Location: Left; Inferior

Maximum size: 0.8 cm; Other 2 dimensions: 0.8 cm x 0.6 cm

Composition: cystic/almost completely cystic (0)

Echogenicity: anechoic (0)

Shape: not taller-than-wide (0)

Margins: smooth (0)

Echogenic foci: none (0)

ACR TI-RADS total points: 0.

ACR TI-RADS risk category: TR1 (0-1 points).

ACR TI-RADS recommendations:

Cystic nodule does not meet criteria for surveillance or biopsy

_________________________________________________________

No adenopathy
IMPRESSION: Left mid thyroid nodule (labeled 1, 1.2 cm, TR 4) meets criteria for
continued surveillance, as designated by the newly established ACR
TI-RADS criteria. Surveillance ultrasound study recommended to be
performed annually up to 5 years.

Recommendations follow those established by the new ACR TI-RADS
criteria ([HOSPITAL] 8763;[DATE]).

## 2022-10-13 ENCOUNTER — Other Ambulatory Visit: Payer: Self-pay | Admitting: Nurse Practitioner

## 2022-10-23 ENCOUNTER — Other Ambulatory Visit: Payer: Self-pay | Admitting: Family Medicine

## 2022-10-25 ENCOUNTER — Ambulatory Visit
Admission: RE | Admit: 2022-10-25 | Discharge: 2022-10-25 | Disposition: A | Discharge status: Home / Self Care | Payer: Commercial Managed Care - PPO | Source: Ambulatory Visit | Attending: Obstetrics and Gynecology | Admitting: Obstetrics and Gynecology

## 2022-10-25 ENCOUNTER — Telehealth: Payer: Self-pay | Admitting: Family Medicine

## 2022-10-25 ENCOUNTER — Other Ambulatory Visit: Payer: Self-pay | Admitting: Obstetrics and Gynecology

## 2022-10-25 DIAGNOSIS — Z1231 Encounter for screening mammogram for malignant neoplasm of breast: Secondary | ICD-10-CM

## 2022-10-25 NOTE — Telephone Encounter (Signed)
Refill on buPROPion (WELLBUTRIN XL) 300 MG 24 hr tablet  sent [place] last filled 07/20/22

## 2022-11-17 LAB — HM COLONOSCOPY

## 2022-11-18 NOTE — Telephone Encounter (Signed)
Prescription was sent into pharmacy 10/25/22

## 2022-12-16 ENCOUNTER — Encounter: Payer: Self-pay | Admitting: *Deleted

## 2023-01-14 ENCOUNTER — Ambulatory Visit (INDEPENDENT_AMBULATORY_CARE_PROVIDER_SITE_OTHER): Payer: Commercial Managed Care - PPO

## 2023-01-14 ENCOUNTER — Ambulatory Visit (INDEPENDENT_AMBULATORY_CARE_PROVIDER_SITE_OTHER): Payer: Commercial Managed Care - PPO | Admitting: Orthopaedic Surgery

## 2023-01-14 ENCOUNTER — Other Ambulatory Visit: Payer: Self-pay | Admitting: Family Medicine

## 2023-01-14 ENCOUNTER — Encounter: Payer: Self-pay | Admitting: Nurse Practitioner

## 2023-01-14 ENCOUNTER — Ambulatory Visit (INDEPENDENT_AMBULATORY_CARE_PROVIDER_SITE_OTHER): Payer: Commercial Managed Care - PPO | Admitting: Nurse Practitioner

## 2023-01-14 VITALS — BP 129/78 | HR 77 | Ht 67.0 in | Wt 179.2 lb

## 2023-01-14 DIAGNOSIS — F418 Other specified anxiety disorders: Secondary | ICD-10-CM

## 2023-01-14 DIAGNOSIS — R600 Localized edema: Secondary | ICD-10-CM | POA: Diagnosis not present

## 2023-01-14 DIAGNOSIS — J300 Vasomotor rhinitis: Secondary | ICD-10-CM | POA: Diagnosis not present

## 2023-01-14 DIAGNOSIS — M25551 Pain in right hip: Secondary | ICD-10-CM | POA: Diagnosis not present

## 2023-01-14 DIAGNOSIS — G43809 Other migraine, not intractable, without status migrainosus: Secondary | ICD-10-CM

## 2023-01-14 MED ORDER — FLUTICASONE PROPIONATE 50 MCG/ACT NA SUSP: 2.0000 | Freq: Every day | NASAL | 5 refills | Status: DC

## 2023-01-14 MED ORDER — HYDROCHLOROTHIAZIDE 25 MG PO TABS
ORAL_TABLET | ORAL | 0 refills | Status: DC
Start: 2023-01-14 — End: 2023-10-19

## 2023-01-14 NOTE — Progress Notes (Signed)
Subjective:    Patient ID: Madison Reyes, female    DOB: 1960/10/25, 62 y.o.   MRN: [id number]  HPI Presents for complaints of generalized headache which she describes as a "pressure overall".  Also intense pain around the neck and upper shoulder area.  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  Has a history of migraines but these have been stable for a long time.  Has had some nausea, no vomiting which is more common when she has a migraine.  Some facial/frontal area pressure.  No relief with OTC meds.  No visual changes.  No difficulty speaking or swallowing.  No numbness or weakness of the face arms or legs.  No fever.  No sore throat.  No ear pain.  Occasional nonproductive cough. Noticed edema of the lower legs and feet after eating a ham sandwich a few days ago. Had a second sandwich the next day. Since then edema has slowly improved. No orthopnea.  Has also been struggling with depression. States most days she does not want to leave the house. Still working part time.   Review of Systems  Constitutional:  Positive for fatigue. Negative for fever.  HENT:  Positive for postnasal drip and sinus pressure. Negative for ear pain and sore throat.   Respiratory:  Positive for cough. Negative for chest tightness, shortness of breath and wheezing.   Cardiovascular:  Positive for leg swelling. Negative for chest pain and palpitations.  Gastrointestinal:  Positive for nausea. Negative for vomiting.  Neurological:  Positive for headaches. Negative for facial asymmetry, speech difficulty and weakness.      01/14/2023   10:18 AM  Depression screen PHQ 2/9  Decreased Interest 1  Down, Depressed, Hopeless 1  PHQ - 2 Score 2  Altered sleeping 3  Tired, decreased energy 3  Change in appetite 3  Feeling bad or failure about yourself  0  Trouble concentrating 0  Moving slowly or fidgety/restless 0  Suicidal thoughts 0  PHQ-9 Score 11  Difficult doing  work/chores Somewhat difficult      01/14/2023   10:18 AM 07/09/2022    1:56 PM 10/23/2021   12:22 PM 05/09/2019    5:09 PM  GAD 7 : Generalized Anxiety Score  Nervous, Anxious, on Edge 1 1 3 1   Control/stop worrying 1 1 3 2   Worry too much - different things 1 1 3 2   Trouble relaxing 1 1 3 1   Restless 1 1 3  0  Easily annoyed or irritable 1 1 3 1   Afraid - awful might happen 0 1 2 1   Total GAD 7 Score 6 7 20 8   Anxiety Difficulty Somewhat difficult Somewhat difficult  Not difficult at all         Objective:   Physical Exam NAD. Alert, oriented. Calm affect. Making good eye contact. Dressed appropriately for the weather.  Speech clear.  Thoughts logical coherent and relevant.  TMs mildly retracted, no erythema. Nasal mucosa: Mildly boggy.  Pharynx clear and moist.  Neck supple with mild soft anterior adenopathy.  Tight tender muscles palpated in the lateral neck and upper back area.  Lungs clear.  Heart regular rate rhythm.  Lower extremities: Trace edema around the ankle area.  The 10-year ASCVD risk score (Arnett DK, et al., 2019) is: 6.2%   Values used to calculate the score:     Age: 53 years     Sex: Female  Is Non-Hispanic African American: Yes     Diabetic: No     Tobacco smoker: No     Systolic Blood Pressure: 129 mmHg     Is BP treated: No     HDL Cholesterol: 57 mg/dL     Total Cholesterol: 222 mg/dL Today's Vitals   16/10/96 1009  BP: 129/78  Pulse: 77  Weight: 179 lb 3.2 oz (81.3 kg)  Height: 5\' 7"  (1.702 m)   Body mass index is 28.07 kg/m.      Assessment & Plan:   Problem List Items Addressed This Visit       Cardiovascular and Mediastinum   Migraine - Primary   Relevant Medications   hydrochlorothiazide (HYDRODIURIL) 25 MG tablet     Other   Depression with anxiety   Other Visit Diagnoses     Mild peripheral edema       Relevant Medications   hydrochlorothiazide (HYDRODIURIL) 25 MG tablet   Vasomotor rhinitis          Meds ordered  this encounter  Medications   fluticasone (FLONASE) 50 MCG/ACT nasal spray    Sig: Place 2 sprays into both nostrils daily.    Dispense:  16 g    Refill:  5    Order Specific Question:   Supervising Provider    Answer:   Lilyan Punt A [[id number]]   hydrochlorothiazide (HYDRODIURIL) 25 MG tablet    Sig: Take 1/2-1 tab po qd prn swelling    Dispense:  30 tablet    Refill:  0    Order Specific Question:   Supervising Provider    Answer:   Babs Sciara [phone]   Explained that she is most likely having mild migraines for several reasons including vasomotor rhinitis, weather change, mental health issues, muscle contraction headaches/muscle spasms and probably worsened by intake of sodium and nitrates and processed food.  Avoid intake of salmon or other processed.  Is much as possible. Discussed anti-inflammatory diet. Consider use of a TENS unit and massage therapy for muscle spasms and neck pain. Encourage patient to restart counseling at her previous [place] provider. Use hydrochlorothiazide as directed as needed swelling. Encouraged increased activity. Return if symptoms worsen or fail to improve.

## 2023-01-14 NOTE — Progress Notes (Signed)
Chief Complaint: Right hip pain     History of Present Illness:    Madison Reyes is a 62 y.o. female presents today with ongoing right hip pain in the setting of known osteoarthritis of the right hip.  She does have a history of a left total hip arthroplasty with Dr. Hurman Horn although this was somewhat of a difficult 37-month recovery following.  She is somewhat hesitant to proceed with the right hip as result of this.  At this time she is having hard time with most activities.  She does feel pain deep in the hip that radiates to the posterior buttocks.  She is having some lateral based hip pain as well    Surgical History:   None  PMH/PSH/Family History/Social History/Meds/Allergies:    Past Medical History:  Diagnosis Date   Anxiety    Fibroids    h/o   H/O dysmenorrhea    H/O menorrhagia    High cholesterol    Menopausal symptoms    h/o   Migraine    Past Surgical History:  Procedure Laterality Date   ABDOMINAL HYSTERECTOMY     BACK SURGERY  2015   disc removal   LIPOMA EXCISION     MYOMECTOMY     ovarian fibroid     REDUCTION MAMMAPLASTY Bilateral    2022   TOTAL HIP ARTHROPLASTY Left 01/2021   UTERINE FIBROID SURGERY     Social History   Socioeconomic History   Marital status: Married    Spouse name: Not on file   Number of children: 1   Years of education: Not on file   Highest education level: Bachelor's degree (e.g., BA, AB, BS)  Occupational History    Employer: [employer]  Tobacco Use   Smoking status: Never   Smokeless tobacco: Never  Substance and Sexual Activity   Alcohol use: Never   Drug use: Never   Sexual activity: Yes    Birth control/protection: None    Comment: no cycles; pt had hysterectomy  Other Topics Concern   Not on file  Social History Narrative   Lives at home with husband Ricky   Caffeine: none   Social Determinants of Corporate investment banker Strain: Not on file   Food Insecurity: Not on file  Transportation Needs: Not on file  Physical Activity: Not on file  Stress: Not on file  Social Connections: Not on file   Family History  Problem Relation Age of Onset   Stroke Paternal Grandfather    Pneumonia Paternal Grandmother    Cancer Father        bone marrow cancer   Prostate cancer Father    COPD Father    Heart disease Father    Dementia Mother    Hypertension Mother    Heart disease Mother    Cancer Sister        uterine   Breast cancer Neg Hx    Allergies  Allergen Reactions   Aspirin     "stomach sensitivity"   Current Outpatient Medications  Medication Sig Dispense Refill   buPROPion (WELLBUTRIN XL) 300 MG 24 hr tablet TAKE 1 TABLET BY MOUTH ONCE DAILY. 90 tablet 0   CALCIUM PO Take by mouth.     Cholecalciferol (VITAMIN D3) 50 MCG (2000 UT) TABS Take  by mouth.     escitalopram (LEXAPRO) 10 MG tablet TAKE ONE TABLET BY MOUTH ONCE DAILY. 90 tablet 0   fluticasone (FLONASE) 50 MCG/ACT nasal spray Place 2 sprays into both nostrils daily. 16 g 5   hydrochlorothiazide (HYDRODIURIL) 25 MG tablet Take 1/2-1 tab po qd prn swelling 30 tablet 0   Multiple Vitamins-Minerals (MULTIVITAMIN PO) Take by mouth.     rosuvastatin (CRESTOR) 10 MG tablet TAKE ONE TABLET BY MOUTH ONCE DAILY. 90 tablet 0   No current facility-administered medications for this visit.   No results found.  Review of Systems:   A ROS was performed including pertinent positives and negatives as documented in the HPI.  Physical Exam :   Constitutional: NAD and appears stated age Neurological: Alert and oriented Psych: Appropriate affect and cooperative There were no vitals taken for this visit.   Comprehensive Musculoskeletal Exam:    Tenderness about the femoral acetabular joint.  She has 10 degrees internal rotation with pain 20 degrees external rotation.  Flexion is to 90 degrees with pain.  C-shaped pain radiating around the hip.  She does have some lateral  trochanteric pain as well  Imaging:   Xray (4 views right hip): Moderate osteoarthritis involving the right hip    I personally reviewed and interpreted the radiographs.   Assessment:   62 y.o. female with right hip moderate osteoarthritis.  Overall I do believe she would be a candidate for hip arthroplasty.  This time she is hoping to avoid this due to the difficulty of recovery of her left total hip arthroplasty for which she ultimately did well.  Given this I have discussed possible hip ultrasound-guided injection which she has elected for.  She would like to proceed with this today.  Plan :    -Right hip injection performed with verbal consent obtained     I personally saw and evaluated the patient, and participated in the management and treatment plan.  Huel Cote, MD Attending Physician, Orthopedic Surgery  This document was dictated using Dragon voice recognition software. A reasonable attempt at proof reading has been made to minimize errors.

## 2023-01-14 NOTE — Patient Instructions (Addendum)
Anti inflammatory diet TENS unit Massage therapy Restart counseling

## 2023-01-21 ENCOUNTER — Ambulatory Visit: Payer: Commercial Managed Care - PPO | Admitting: Podiatry

## 2023-01-27 ENCOUNTER — Encounter: Payer: Self-pay | Admitting: "Endocrinology

## 2023-01-27 DIAGNOSIS — E042 Nontoxic multinodular goiter: Secondary | ICD-10-CM

## 2023-01-27 DIAGNOSIS — E039 Hypothyroidism, unspecified: Secondary | ICD-10-CM

## 2023-02-02 LAB — COMPREHENSIVE METABOLIC PANEL
ALT: 32 IU/L (ref 0–32)
AST: 21 IU/L (ref 0–40)
Albumin/Globulin Ratio: 1.9
Albumin: 4.4 g/dL (ref 3.9–4.9)
Alkaline Phosphatase: 57 IU/L (ref 44–121)
BUN/Creatinine Ratio: 15 (ref 12–28)
BUN: 14 mg/dL (ref 8–27)
Bilirubin Total: 0.8 mg/dL (ref 0.0–1.2)
CO2: 21 mmol/L (ref 20–29)
Calcium: 9.6 mg/dL (ref 8.7–10.3)
Chloride: 101 mmol/L (ref 96–106)
Creatinine, Ser: 0.92 mg/dL (ref 0.57–1.00)
Globulin, Total: 2.3 g/dL (ref 1.5–4.5)
Glucose: 138 mg/dL — ABNORMAL HIGH (ref 70–99)
Potassium: 3.6 mmol/L (ref 3.5–5.2)
Sodium: 140 mmol/L (ref 134–144)
Total Protein: 6.7 g/dL (ref 6.0–8.5)
eGFR: 71 mL/min/{1.73_m2} (ref >59)

## 2023-02-02 LAB — TSH: TSH: 1.07 u[IU]/mL (ref 0.450–4.500)

## 2023-02-02 LAB — T3, FREE: T3, Free: 2.2 pg/mL (ref 2.0–4.4)

## 2023-02-02 LAB — T4, FREE: Free T4: 1.02 ng/dL (ref 0.82–1.77)

## 2023-02-02 NOTE — Telephone Encounter (Signed)
Pts lab results are in can you please advise

## 2023-02-20 ENCOUNTER — Telehealth: Payer: Self-pay | Admitting: Family Medicine

## 2023-02-20 NOTE — Telephone Encounter (Signed)
Received labs from [facility] These will be scanned into the system Hemoglobin 13.2, white count 5.1, glucose 86, creatinine 0.9, GFR 73, electrolytes calcium looks good, total cholesterol 205, HDL 60, LDL 122, A1c 5.2, TSH 1.12, B12 1223, insulin level 23

## 2023-03-03 ENCOUNTER — Encounter: Payer: Self-pay | Admitting: Nurse Practitioner

## 2023-03-04 ENCOUNTER — Other Ambulatory Visit: Payer: Self-pay | Admitting: Nurse Practitioner

## 2023-03-04 DIAGNOSIS — Z713 Dietary counseling and surveillance: Secondary | ICD-10-CM

## 2023-03-14 NOTE — Telephone Encounter (Signed)
She has had previous labs drawn with Eber Jones for rheumatologic which were negative.  I recommend a follow-up visit with Eber Jones or myself then we can discuss whether or not further testing is needed and also help with any referrals thank you

## 2023-03-16 ENCOUNTER — Ambulatory Visit: Payer: Commercial Managed Care - PPO | Admitting: Dietician

## 2023-04-19 ENCOUNTER — Other Ambulatory Visit: Payer: Self-pay | Admitting: *Deleted

## 2023-04-19 MED ORDER — BUPROPION HCL ER (XL) 300 MG PO TB24: 300.0000 mg | ORAL_TABLET | Freq: Every day | ORAL | 0 refills | Status: DC

## 2023-04-19 MED ORDER — ROSUVASTATIN CALCIUM 10 MG PO TABS: 10.0000 mg | ORAL_TABLET | Freq: Every day | ORAL | 0 refills | Status: DC

## 2023-04-19 MED ORDER — FLUTICASONE PROPIONATE 50 MCG/ACT NA SUSP: 2.0000 | Freq: Every day | NASAL | 5 refills | Status: DC

## 2023-04-19 MED ORDER — ESCITALOPRAM OXALATE 10 MG PO TABS: 10.0000 mg | ORAL_TABLET | Freq: Every day | ORAL | 0 refills | Status: DC

## 2023-04-22 ENCOUNTER — Telehealth: Payer: Self-pay

## 2023-04-22 ENCOUNTER — Other Ambulatory Visit: Payer: Self-pay

## 2023-04-22 DIAGNOSIS — E785 Hyperlipidemia, unspecified: Secondary | ICD-10-CM

## 2023-04-22 DIAGNOSIS — Z79899 Other long term (current) drug therapy: Secondary | ICD-10-CM

## 2023-04-22 NOTE — Telephone Encounter (Signed)
Called and left a message for patient - doctor recommends office visit in November with him or Ms Eber Jones. Labs have been ordered

## 2023-05-05 ENCOUNTER — Encounter: Payer: Self-pay | Admitting: Nurse Practitioner

## 2023-05-09 ENCOUNTER — Ambulatory Visit: Payer: Commercial Managed Care - PPO | Admitting: Nutrition

## 2023-05-24 ENCOUNTER — Telehealth: Payer: Self-pay | Admitting: Family Medicine

## 2023-05-24 NOTE — Telephone Encounter (Signed)
Patient seen Madison Reyes and she suggested she see neurologist for nerve/joint pain. She is wanting that referral now. Please advise

## 2023-05-25 ENCOUNTER — Other Ambulatory Visit: Payer: Self-pay | Admitting: Nurse Practitioner

## 2023-05-25 DIAGNOSIS — G43709 Chronic migraine without aura, not intractable, without status migrainosus: Secondary | ICD-10-CM

## 2023-05-25 NOTE — Telephone Encounter (Signed)
See mychart message. Referral placed.  

## 2023-05-31 ENCOUNTER — Telehealth: Payer: Self-pay

## 2023-05-31 NOTE — Telephone Encounter (Signed)
Left a message requesting pt return call to the office. 

## 2023-06-21 ENCOUNTER — Encounter: Payer: Self-pay | Admitting: Nurse Practitioner

## 2023-06-27 ENCOUNTER — Encounter (INDEPENDENT_AMBULATORY_CARE_PROVIDER_SITE_OTHER): Payer: Self-pay

## 2023-07-06 ENCOUNTER — Ambulatory Visit (HOSPITAL_BASED_OUTPATIENT_CLINIC_OR_DEPARTMENT_OTHER): Payer: Commercial Managed Care - PPO | Admitting: Orthopaedic Surgery

## 2023-07-12 ENCOUNTER — Encounter (HOSPITAL_BASED_OUTPATIENT_CLINIC_OR_DEPARTMENT_OTHER): Payer: Self-pay | Admitting: Student

## 2023-07-12 ENCOUNTER — Ambulatory Visit (HOSPITAL_BASED_OUTPATIENT_CLINIC_OR_DEPARTMENT_OTHER): Payer: Commercial Managed Care - PPO | Admitting: Student

## 2023-07-12 ENCOUNTER — Ambulatory Visit (HOSPITAL_BASED_OUTPATIENT_CLINIC_OR_DEPARTMENT_OTHER): Payer: Commercial Managed Care - PPO

## 2023-07-12 ENCOUNTER — Other Ambulatory Visit: Payer: Self-pay | Admitting: Medical Genetics

## 2023-07-12 DIAGNOSIS — M79641 Pain in right hand: Secondary | ICD-10-CM | POA: Diagnosis not present

## 2023-07-12 DIAGNOSIS — M79642 Pain in left hand: Secondary | ICD-10-CM | POA: Diagnosis not present

## 2023-07-12 DIAGNOSIS — Z006 Encounter for examination for normal comparison and control in clinical research program: Secondary | ICD-10-CM

## 2023-07-12 DIAGNOSIS — M19042 Primary osteoarthritis, left hand: Secondary | ICD-10-CM

## 2023-07-12 DIAGNOSIS — M1611 Unilateral primary osteoarthritis, right hip: Secondary | ICD-10-CM | POA: Diagnosis not present

## 2023-07-12 DIAGNOSIS — M19041 Primary osteoarthritis, right hand: Secondary | ICD-10-CM | POA: Diagnosis not present

## 2023-07-12 MED ORDER — LIDOCAINE HCL 1 % IJ SOLN
4.0000 mL | INTRAMUSCULAR | Status: AC | PRN
  Administered 2023-07-12: 4 mL

## 2023-07-12 MED ORDER — TRIAMCINOLONE ACETONIDE 40 MG/ML IJ SUSP
2.0000 mL | INTRAMUSCULAR | Status: AC | PRN
  Administered 2023-07-12: 2 mL via INTRA_ARTICULAR

## 2023-07-12 NOTE — Progress Notes (Signed)
Chief Complaint: Right hip pain, bilateral hand pain     History of Present Illness:    Madison Reyes is a very pleasant 62 y.o. female presenting today for repeat injection of her right hip as well as evaluation of bilateral hand pain.  She has known history of right hip osteoarthritis and received a cortisone injection with Dr. Steward Drone on 01/14/2023.  She states that this got her about 4 months of good relief.  Wants to avoid having a right hip replacement as she already had a left THA 2 years ago.  Also states that her fingers have now began bothering her in both hands.  She states that they feel stiff and swollen.  This typically tends to hurt worse at night.  Pain level is overall moderate.   Surgical History:   None  PMH/PSH/Family History/Social History/Meds/Allergies:    Past Medical History:  Diagnosis Date   Anxiety    Fibroids    h/o   H/O dysmenorrhea    H/O menorrhagia    High cholesterol    Menopausal symptoms    h/o   Migraine    Past Surgical History:  Procedure Laterality Date   ABDOMINAL HYSTERECTOMY     BACK SURGERY  2015   disc removal   LIPOMA EXCISION     MYOMECTOMY     ovarian fibroid     REDUCTION MAMMAPLASTY Bilateral    2022   TOTAL HIP ARTHROPLASTY Left 01/2021   UTERINE FIBROID SURGERY     Social History   Socioeconomic History   Marital status: Married    Spouse name: Not on file   Number of children: 1   Years of education: Not on file   Highest education level: Bachelor's degree (e.g., BA, AB, BS)  Occupational History    Employer: [employer]  Tobacco Use   Smoking status: Never   Smokeless tobacco: Never  Substance and Sexual Activity   Alcohol use: Never   Drug use: Never   Sexual activity: Yes    Birth control/protection: None    Comment: no cycles; pt had hysterectomy  Other Topics Concern   Not on file  Social History Narrative   Lives at home with husband Ricky    Caffeine: none   Social Determinants of Health   Financial Resource Strain: Not on file  Food Insecurity: No Food Insecurity (11/12/2020)   Received from [facility] visits prior to 10/23/2022., [facility] visits prior to 10/23/2022.   Hunger Vital Sign    Worried About Programme researcher, broadcasting/film/video in the Last Year: Never true    Ran Out of Food in the Last Year: Never true  Transportation Needs: Not on file  Physical Activity: Not on file  Stress: Not on file  Social Connections: Not on file   Family History  Problem Relation Age of Onset   Stroke Paternal Grandfather    Pneumonia Paternal Grandmother    Cancer Father        bone marrow cancer   Prostate cancer Father    COPD Father    Heart disease Father    Dementia Mother    Hypertension Mother    Heart disease Mother    Cancer Sister        uterine  Breast cancer Neg Hx    Allergies  Allergen Reactions   Aspirin     "stomach sensitivity"   Current Outpatient Medications  Medication Sig Dispense Refill   buPROPion (WELLBUTRIN XL) 300 MG 24 hr tablet Take 1 tablet (300 mg total) by mouth daily. 90 tablet 0   CALCIUM PO Take by mouth.     Cholecalciferol (VITAMIN D3) 50 MCG (2000 UT) TABS Take by mouth.     escitalopram (LEXAPRO) 10 MG tablet Take 1 tablet (10 mg total) by mouth daily. 90 tablet 0   fluticasone (FLONASE) 50 MCG/ACT nasal spray Place 2 sprays into both nostrils daily. 16 g 5   hydrochlorothiazide (HYDRODIURIL) 25 MG tablet Take 1/2-1 tab po qd prn swelling 30 tablet 0   Multiple Vitamins-Minerals (MULTIVITAMIN PO) Take by mouth.     rosuvastatin (CRESTOR) 10 MG tablet Take 1 tablet (10 mg total) by mouth daily. 90 tablet 0   No current facility-administered medications for this visit.   No results found.  Review of Systems:   A ROS was performed including pertinent positives and negatives as documented in the HPI.  Physical Exam :   Constitutional: NAD and  appears stated age Neurological: Alert and oriented Psych: Appropriate affect and cooperative There were no vitals taken for this visit.   Comprehensive Musculoskeletal Exam:    Active range of motion of the right hip to 90 degrees flexion, 20 degrees external rotation, and 10 degrees internal rotation.  Inspection of bilateral hands reveals swelling noted at the PIP and DIP joints of the fingers.  Finger joints are mildly tender to palpation.  Imaging:   Xray (right hand 3 views, left hand 3 views): Osteoarthritis of the fingers most notable at the PIP joints.  No acute abnormality.   I personally reviewed and interpreted the radiographs.   Assessment:   62 y.o. female with osteoarthritis of the right hip joint.  She did get about 4 months of relief with last cortisone injection so we will repeat this today.  Injection was performed of the right hip joint under ultrasound guidance without any complication and she tolerated this extremely well.  Will plan to have her continue to follow-up with myself or Dr. Steward Drone for repeat injections and reassessment as needed.  She does also have evidence of osteoarthritis in both hands mainly of the PIP joints.  I have encouraged use of heat as well as Voltaren for initial treatment.  Will plan to assess relief with this and continue following as needed.  Plan :    -Right hip injection performed with verbal consent obtained -Follow-up as needed     Procedure Note  Patient: Madison Reyes             Date of Birth: October 24, 1960           MRN: [id number]             Visit Date: 07/12/2023  Procedures: Visit Diagnoses:  1. Unilateral primary osteoarthritis, right hip   2. Primary osteoarthritis of both hands     Large Joint Inj: R hip joint on 07/12/2023 6:41 PM Indications: pain Details: 22 G 3.5 in needle, ultrasound-guided anterior approach Medications: 4 mL lidocaine 1 %; 2 mL triamcinolone acetonide 40 MG/ML Outcome: tolerated well,  no immediate complications Procedure, treatment alternatives, risks and benefits explained, specific risks discussed. Consent was given by the patient. Immediately prior to procedure a time out was called to verify the correct patient, procedure, equipment, support  staff and site/side marked as required. Patient was prepped and draped in the usual sterile fashion.      I personally saw and evaluated the patient, and participated in the management and treatment plan.   Hazle Nordmann, PA-C Orthopedics

## 2023-07-26 ENCOUNTER — Encounter (HOSPITAL_BASED_OUTPATIENT_CLINIC_OR_DEPARTMENT_OTHER): Payer: Self-pay | Admitting: Student

## 2023-07-26 ENCOUNTER — Ambulatory Visit (HOSPITAL_BASED_OUTPATIENT_CLINIC_OR_DEPARTMENT_OTHER): Payer: Commercial Managed Care - PPO | Admitting: Student

## 2023-07-26 ENCOUNTER — Ambulatory Visit (HOSPITAL_BASED_OUTPATIENT_CLINIC_OR_DEPARTMENT_OTHER): Payer: Commercial Managed Care - PPO

## 2023-07-26 DIAGNOSIS — M542 Cervicalgia: Secondary | ICD-10-CM

## 2023-07-26 DIAGNOSIS — M25551 Pain in right hip: Secondary | ICD-10-CM

## 2023-07-26 DIAGNOSIS — M5441 Lumbago with sciatica, right side: Secondary | ICD-10-CM

## 2023-07-26 NOTE — Progress Notes (Signed)
Chief Complaint: Right hip pain, low back, and neck pain     History of Present Illness:    Madison Reyes is a pleasant 62 y.o. female here today for evaluation after a fall onto her right side on 07/20/2023.  I did see her 8 days prior on 11/19 for an intra-articular right hip injection which she states had been giving her great relief until her fall.  Patient was holding her grandson and fell backwards onto her right buttock onto hardwood floor.  Today she reports pain on the lateral side of the right hip, and the low back, as well as her neck.  She does have some numbness and tingling down the lateral right thigh.  Denies any groin pain.  No radiating pain into the arms.  Surgical History:   None  PMH/PSH/Family History/Social History/Meds/Allergies:    Past Medical History:  Diagnosis Date   Anxiety    Fibroids    h/o   H/O dysmenorrhea    H/O menorrhagia    High cholesterol    Menopausal symptoms    h/o   Migraine    Past Surgical History:  Procedure Laterality Date   ABDOMINAL HYSTERECTOMY     BACK SURGERY  2015   disc removal   LIPOMA EXCISION     MYOMECTOMY     ovarian fibroid     REDUCTION MAMMAPLASTY Bilateral    2022   TOTAL HIP ARTHROPLASTY Left 01/2021   UTERINE FIBROID SURGERY     Social History   Socioeconomic History   Marital status: Married    Spouse name: Not on file   Number of children: 1   Years of education: Not on file   Highest education level: Bachelor's degree (e.g., BA, AB, BS)  Occupational History    Employer: [employer]  Tobacco Use   Smoking status: Never   Smokeless tobacco: Never  Substance and Sexual Activity   Alcohol use: Never   Drug use: Never   Sexual activity: Yes    Birth control/protection: None    Comment: no cycles; pt had hysterectomy  Other Topics Concern   Not on file  Social History Narrative   Lives at home with husband Ricky   Caffeine: none    Social Determinants of Health   Financial Resource Strain: Not on file  Food Insecurity: No Food Insecurity (11/12/2020)   [facility] from [facility] prior to 10/23/2022., [facility] visits prior to 10/23/2022.   Hunger Vital Sign    Worried About Programme researcher, broadcasting/film/video in the Last Year: Never true    Ran Out of Food in the Last Year: Never true  Transportation Needs: Not on file  Physical Activity: Not on file  Stress: Not on file  Social Connections: Not on file   Family History  Problem Relation Age of Onset   Stroke Paternal Grandfather    Pneumonia Paternal Grandmother    Cancer Father        bone marrow cancer   Prostate cancer Father    COPD Father    Heart disease Father    Dementia Mother    Hypertension Mother    Heart disease Mother    Cancer Sister        uterine   Breast  cancer Neg Hx    Allergies  Allergen Reactions   Aspirin     "stomach sensitivity"   Current Outpatient Medications  Medication Sig Dispense Refill   buPROPion (WELLBUTRIN XL) 300 MG 24 hr tablet Take 1 tablet (300 mg total) by mouth daily. 90 tablet 0   CALCIUM PO Take by mouth.     Cholecalciferol (VITAMIN D3) 50 MCG (2000 UT) TABS Take by mouth.     escitalopram (LEXAPRO) 10 MG tablet Take 1 tablet (10 mg total) by mouth daily. 90 tablet 0   fluticasone (FLONASE) 50 MCG/ACT nasal spray Place 2 sprays into both nostrils daily. 16 g 5   hydrochlorothiazide (HYDRODIURIL) 25 MG tablet Take 1/2-1 tab po qd prn swelling 30 tablet 0   Multiple Vitamins-Minerals (MULTIVITAMIN PO) Take by mouth.     rosuvastatin (CRESTOR) 10 MG tablet Take 1 tablet (10 mg total) by mouth daily. 90 tablet 0   No current facility-administered medications for this visit.   No results found.  Review of Systems:   A ROS was performed including pertinent positives and negatives as documented in the HPI.  Physical Exam :   Constitutional: NAD and appears stated  age Neurological: Alert and oriented Psych: Appropriate affect and cooperative There were no vitals taken for this visit.   Comprehensive Musculoskeletal Exam:    Tenderness of the midline and right lumbar region.  Normal lumbar ROM with flexion, extension, and rotation.  Passive right hip ROM to 110 degrees flexion, 20 ER, and 10 IR.  5/5 strength with knee flexion/extension and ankle dorsiflexion/plantarflexion.  Tenderness in the cervical paraspinal muscles.  Slightly reduced ROM in the cervical spine.  Grip strength 5/5 bilaterally.  Imaging:   Xray (lumbar spine 4 views, AP pelvis): No evidence of acute fracture or dislocation.  Degenerative changes in the lumbar spine most notable between L2-L3 and L5-S1.  Moderate osteoarthritis of the right hip.  Well-appearing left hip THA components.  Xray (cervical spine 4 views): Multilevel spondylosis most notable between C3-C5 with anterior osteophyte of the C3 vertebral body  I personally reviewed and interpreted the radiographs.   Assessment:   62 y.o. female with pain in her cervical and lumbar spine as well as the right hip after a backwards fall onto hardwood floors.  No evidence on today's x-rays of any acute bony injury.  She does have existing right hip osteoarthritis and has gotten good relief from recent injection so I'm hopeful this will continue.  She does have some mild degenerative changes in the lumbar spine but some of her pain does appear muscular.  Degeneration in the neck is a little bit more significant so I do suspect this got aggravated from the impact as well as some residual muscle pain.  Will proceed with use of Tylenol and anti-inflammatories as well as heat/ice.  Will have her return should symptoms not show gradual improvement.  Plan :    -Return to clinic as needed    I personally saw and evaluated the patient, and participated in the management and treatment plan.   Hazle Nordmann, PA-C Orthopedics

## 2023-07-31 ENCOUNTER — Other Ambulatory Visit: Payer: Self-pay | Admitting: Family Medicine

## 2023-08-02 ENCOUNTER — Telehealth: Payer: Self-pay | Admitting: "Endocrinology

## 2023-08-02 DIAGNOSIS — E039 Hypothyroidism, unspecified: Secondary | ICD-10-CM

## 2023-08-02 NOTE — Telephone Encounter (Signed)
Order updated

## 2023-08-02 NOTE — Telephone Encounter (Signed)
Can you update these labs she's going in the morning

## 2023-08-04 ENCOUNTER — Ambulatory Visit: Payer: Self-pay | Admitting: Family Medicine

## 2023-08-04 LAB — COMPREHENSIVE METABOLIC PANEL
ALT: 21 [IU]/L (ref 0–32)
AST: 18 [IU]/L (ref 0–40)
Albumin: 4.2 g/dL (ref 3.9–4.9)
Alkaline Phosphatase: 53 [IU]/L (ref 44–121)
BUN/Creatinine Ratio: 17 (ref 12–28)
BUN: 12 mg/dL (ref 8–27)
Bilirubin Total: 0.7 mg/dL (ref 0.0–1.2)
CO2: 22 mmol/L (ref 20–29)
Calcium: 9 mg/dL (ref 8.7–10.3)
Chloride: 104 mmol/L (ref 96–106)
Creatinine, Ser: 0.7 mg/dL (ref 0.57–1.00)
Globulin, Total: 2.3 g/dL (ref 1.5–4.5)
Glucose: 82 mg/dL (ref 70–99)
Potassium: 4 mmol/L (ref 3.5–5.2)
Sodium: 139 mmol/L (ref 134–144)
Total Protein: 6.5 g/dL (ref 6.0–8.5)
eGFR: 98 mL/min/{1.73_m2} (ref >59)

## 2023-08-04 LAB — T3, FREE: T3, Free: 2.9 pg/mL (ref 2.0–4.4)

## 2023-08-04 LAB — TSH: TSH: 1.79 u[IU]/mL (ref 0.450–4.500)

## 2023-08-04 LAB — T4, FREE: Free T4: 1.07 ng/dL (ref 0.82–1.77)

## 2023-08-04 NOTE — Telephone Encounter (Signed)
  Chief Complaint: Medication Question Symptoms: Nausea Frequency: intermittent Pertinent Negatives: Patient denies vomiting Disposition: [] ED /[] Urgent Care (no appt availability in office) / [x] Appointment(In office/virtual)/ []  [facility]/ [] Home Care/ [] Refused Recommended Disposition /[]  Mobile Bus/ []  Follow-up with PCP Additional Notes: patient calling with c/o nausea with having run out of Wellbutrin. Patient is awaiting her medication to arrive from mail order. Patient states that she is having some nausea and would like to see a provider tomorrow. Appointment set up for 08/05/2023 at 9:20am. Patient was calling while trying to walk into work and needed to get off the phone quickly. Verbalized understanding of plan for appointment and was quick off the phone.   Copied from CRM [id number]. Topic: Clinical - Red Word Triage >> Aug 04, 2023 12:52 PM Clayton Bibles wrote: Red Word that prompted transfer to Nurse Triage: Wellbutrin withdraws Reason for Disposition  Prescription request for new medicine (not a refill)  Answer Assessment - Initial Assessment Questions 1. NAME of MEDICINE: "What medicine(s) are you calling about?"     Wellbutrin 2. QUESTION: "What is your question?" (e.g., double dose of medicine, side effect)     Medication ran out and is awaiting medication for mail order.  3. PRESCRIBER: "Who prescribed the medicine?" Reason: if prescribed by specialist, call should be referred to that group.     Dr. Lilyan Punt 4. SYMPTOMS: "Do you have any symptoms?" If Yes, ask: "What symptoms are you having?"  "How bad are the symptoms (e.g., mild, moderate, severe)     Patient has been without her meds-having nausea-mild  Protocols used: Medication Question Call-A-AH

## 2023-08-04 NOTE — Telephone Encounter (Signed)
Noted  

## 2023-08-05 ENCOUNTER — Ambulatory Visit (INDEPENDENT_AMBULATORY_CARE_PROVIDER_SITE_OTHER): Payer: Commercial Managed Care - PPO | Admitting: "Endocrinology

## 2023-08-05 ENCOUNTER — Encounter: Payer: Self-pay | Admitting: "Endocrinology

## 2023-08-05 ENCOUNTER — Ambulatory Visit (INDEPENDENT_AMBULATORY_CARE_PROVIDER_SITE_OTHER): Payer: Commercial Managed Care - PPO | Admitting: Family Medicine

## 2023-08-05 VITALS — BP 128/76 | HR 60 | Ht 67.0 in | Wt 179.8 lb

## 2023-08-05 VITALS — BP 135/76 | Ht 67.0 in | Wt 181.2 lb

## 2023-08-05 DIAGNOSIS — E782 Mixed hyperlipidemia: Secondary | ICD-10-CM | POA: Diagnosis not present

## 2023-08-05 DIAGNOSIS — F418 Other specified anxiety disorders: Secondary | ICD-10-CM

## 2023-08-05 DIAGNOSIS — E042 Nontoxic multinodular goiter: Secondary | ICD-10-CM

## 2023-08-05 LAB — POCT GLYCOSYLATED HEMOGLOBIN (HGB A1C): Hemoglobin A1C: 5 % (ref 4.0–5.6)

## 2023-08-05 MED ORDER — BUPROPION HCL ER (XL) 300 MG PO TB24: 300.0000 mg | ORAL_TABLET | Freq: Every day | ORAL | 0 refills | Status: DC

## 2023-08-05 NOTE — Patient Instructions (Signed)
Go get your medication and restart.  I hope you feel better.  Take care  Dr. Adriana Simas

## 2023-08-05 NOTE — Assessment & Plan Note (Signed)
Currently out of Wellbutrin.  Needs to restart.  Having side effects from being out of medication.  Rx sent to local pharmacy

## 2023-08-05 NOTE — Progress Notes (Signed)
Subjective:  Patient ID: Madison Reyes, female    DOB: 11/19/1960  Age: 62 y.o. MRN: [id number]  CC:   Chief Complaint  Patient presents with   Nausea    Been out of Wellbutrin for last week - waiting for it through mail order    HPI:  62 year old female presents for evaluation of the above.  Patient has been out of her Wellbutrin due to an issue with her insurance and the pharmacy.  She states that she is overall not feeling well.  She states that she has been taking Tylenol and ibuprofen.  She needs medication refill to local pharmacy until her medication arrives via mail order.  Patient Active Problem List   Diagnosis Date Noted   Multinodular goiter 01/26/2021   Atrophic vaginitis 04/03/2019   Female stress incontinence 04/03/2019   Hyperlipidemia 04/03/2019   Menopausal syndrome 04/03/2019   Chronic migraine without aura without status migrainosus, not intractable 06/15/2018   Chronic idiopathic constipation 10/11/2017   Status post lumbar laminectomy 02/05/2014   Lumbar stenosis 01/22/2014   Anxiety 01/07/2014   Depression with anxiety 04/02/2013    Social Hx   Social History   Socioeconomic History   Marital status: Married    Spouse name: Not on file   Number of children: 1   Years of education: Not on file   Highest education level: Bachelor's degree (e.g., BA, AB, BS)  Occupational History    Employer: [employer]  Tobacco Use   Smoking status: Never   Smokeless tobacco: Never  Substance and Sexual Activity   Alcohol use: Never   Drug use: Never   Sexual activity: Yes    Birth control/protection: None    Comment: no cycles; pt had hysterectomy  Other Topics Concern   Not on file  Social History Narrative   Lives at home with husband Ricky   Caffeine: none   Social Drivers of Corporate investment banker Strain: Not on file  Food Insecurity: No Food Insecurity (11/12/2020)   Received from [facility] visits  prior to 10/23/2022., [facility] visits prior to 10/23/2022.   Hunger Vital Sign    Worried About Programme researcher, broadcasting/film/video in the Last Year: Never true    Ran Out of Food in the Last Year: Never true  Transportation Needs: Not on file  Physical Activity: Not on file  Stress: Not on file  Social Connections: Not on file    Review of Systems Per HPI  Objective:  BP 135/76   Ht 5\' 7"  (1.702 m)   Wt 181 lb 3.2 oz (82.2 kg)   BMI 28.38 kg/m      08/05/2023    9:33 AM 01/14/2023   10:09 AM 08/03/2022    2:41 PM  BP/Weight  Systolic BP 135 129 126  Diastolic BP 76 78 78  Wt. (Lbs) 181.2 179.2 --  BMI 28.38 kg/m2 28.07 kg/m2     Physical Exam Vitals and nursing note reviewed.  Constitutional:      General: She is not in acute distress.    Appearance: Normal appearance.  Cardiovascular:     Rate and Rhythm: Normal rate and regular rhythm.  Pulmonary:     Effort: Pulmonary effort is normal.     Breath sounds: Normal breath sounds. No wheezing, rhonchi or rales.  Neurological:     Mental Status: She is alert.  Psychiatric:  Mood and Affect: Mood normal.        Behavior: Behavior normal.     Lab Results  Component Value Date   WBC 5.9 07/09/2022   HGB 13.5 07/09/2022   HCT 39.9 07/09/2022   PLT 246 07/09/2022   GLUCOSE 82 08/03/2023   CHOL 222 (H) 07/28/2022   TRIG 103 07/28/2022   HDL 57 07/28/2022   LDLCALC 147 (H) 07/28/2022   ALT 21 08/03/2023   AST 18 08/03/2023   NA 139 08/03/2023   K 4.0 08/03/2023   CL 104 08/03/2023   CREATININE 0.70 08/03/2023   BUN 12 08/03/2023   CO2 22 08/03/2023   TSH 1.790 08/03/2023   INR 1.0 01/22/2021   HGBA1C 5.1 01/22/2021     Assessment & Plan:   Problem List Items Addressed This Visit       Other   Depression with anxiety - Primary   Currently out of Wellbutrin.  Needs to restart.  Having side effects from being out of medication.  Rx sent to local pharmacy      Relevant Medications    buPROPion (WELLBUTRIN XL) 300 MG 24 hr tablet    Meds ordered this encounter  Medications   buPROPion (WELLBUTRIN XL) 300 MG 24 hr tablet    Sig: Take 1 tablet (300 mg total) by mouth daily.    Dispense:  30 tablet    Refill:  0    Tymira Horkey DO [facility]

## 2023-08-05 NOTE — Progress Notes (Signed)
08/05/2023, 7:25 PM  Endocrinology follow-up note   Subjective:    Patient ID: Madison Reyes, female    DOB: 05/16/1961, PCP Madison Sciara, MD   Past Medical History:  Diagnosis Date   Anxiety    Fibroids    h/o   H/O dysmenorrhea    H/O menorrhagia    High cholesterol    Menopausal symptoms    h/o   Migraine    Past Surgical History:  Procedure Laterality Date   ABDOMINAL HYSTERECTOMY     BACK SURGERY  2015   disc removal   LIPOMA EXCISION     MYOMECTOMY     ovarian fibroid     REDUCTION MAMMAPLASTY Bilateral    2022   TOTAL HIP ARTHROPLASTY Left 01/2021   UTERINE FIBROID SURGERY     Social History   Socioeconomic History   Marital status: Married    Spouse name: Not on file   Number of children: 1   Years of education: Not on file   Highest education level: Bachelor's degree (e.g., BA, AB, BS)  Occupational History    Employer: [employer]  Tobacco Use   Smoking status: Never   Smokeless tobacco: Never  Substance and Sexual Activity   Alcohol use: Never   Drug use: Never   Sexual activity: Yes    Birth control/protection: None    Comment: no cycles; pt had hysterectomy  Other Topics Concern   Not on file  Social History Narrative   Lives at home with husband Madison Reyes   Caffeine: none   Social Drivers of Corporate investment banker Strain: Not on file  Food Insecurity: No Food Insecurity (11/12/2020)   [facility] from [facility] visits prior to 10/23/2022., [facility] visits prior to 10/23/2022.   Hunger Vital Sign    Worried About Programme researcher, broadcasting/film/video in the Last Year: Never true    Ran Out of Food in the Last Year: Never true  Transportation Needs: Not on file  Physical Activity: Not on file  Stress: Not on file  Social Connections: Not on file   Family History  Problem Relation Age of Onset   Stroke Paternal Grandfather     Pneumonia Paternal Grandmother    Cancer Father        bone marrow cancer   Prostate cancer Father    COPD Father    Heart disease Father    Dementia Mother    Hypertension Mother    Heart disease Mother    Cancer Sister        uterine   Breast cancer Neg Hx    Outpatient Encounter Medications as of 08/05/2023  Medication Sig   buPROPion (WELLBUTRIN XL) 300 MG 24 hr tablet Take 1 tablet (300 mg total) by mouth daily.   CALCIUM PO Take by mouth.   Cholecalciferol (VITAMIN D3) 50 MCG (2000 UT) TABS Take by mouth.   escitalopram (LEXAPRO) 10 MG tablet TAKE 1 TABLET DAILY   fluticasone (FLONASE) 50 MCG/ACT nasal spray Place 2 sprays into both nostrils daily.   hydrochlorothiazide (HYDRODIURIL) 25 MG tablet Take 1/2-1 tab po qd prn swelling   Multiple Vitamins-Minerals (MULTIVITAMIN  PO) Take by mouth.   rosuvastatin (CRESTOR) 10 MG tablet TAKE 1 TABLET DAILY   No facility-administered encounter medications on file as of 08/05/2023.   ALLERGIES: Allergies  Allergen Reactions   Aspirin     "stomach sensitivity"    VACCINATION STATUS: Immunization History  Administered Date(s) Administered   Moderna Sars-Covid-2 Vaccination 02/01/2020, 03/03/2020    HPI Madison Reyes is 62 y.o. female who presents today with a medical history as above. she is being seen in follow-up after she was seen in consultation for hypothyroidism requested by Madison Sciara, MD.   Since her last visit in June 2021, she stayed off of any thyroid hormone supplements. Her pre-visit thyroid function test are consistent with euthyroid state.      She was treated in the past with low-dose Armour Thyroid.  -She denies palpitations, tremors, heat/cold intolerance. -She did not have acute complaints today.  She presents with weight gain.  She has family history of hypothyroidism in one of her siblings, goiter which required surgery in her mother.  She denies any family history of thyroid malignancy.  She has  severe dyslipidemia, started Crestor 10 mg po qhs in the interim. Her LDL is significantly better than before but still significantly above target.   She underwent thyroid ultrasound which is  unremarkable.   Review of Systems  Constitutional: +weight gain, + fatigue, no subjective hyperthermia, no subjective hypothermia   Objective:       08/05/2023   11:03 AM 08/05/2023    9:33 AM 01/14/2023   10:09 AM  Vitals with BMI  Height 5\' 7"  5\' 7"  5\' 7"   Weight 179 lbs 13 oz 181 lbs 3 oz 179 lbs 3 oz  BMI 28.15 28.37 28.06  Systolic 128 135 161  Diastolic 76 76 78  Pulse 60  77    BP 128/76   Pulse 60   Ht 5\' 7"  (1.702 m)   Wt 179 lb 12.8 oz (81.6 kg)   BMI 28.16 kg/m   Wt Readings from Last 3 Encounters:  08/05/23 179 lb 12.8 oz (81.6 kg)  08/05/23 181 lb 3.2 oz (82.2 kg)  01/14/23 179 lb 3.2 oz (81.3 kg)    Physical Exam   Constitutional:  Body mass index is 28.16 kg/m.,  not in acute distress, normal state of mind Eyes: PERRLA, EOMI, no exophthalmos ENT: moist mucous membranes, + gross thyromegaly, no gross cervical lymphadenopathy  Thyroid ultrasound from Jan 06, 2021: Right lobe 5.2 cm, left lobe 5.1 cm.  1.2 cm nodule in the left mid lobe.  No suspicious features, meets criteria for continued surveillance.  CMP ( most recent) CMP     Component Value Date/Time   NA 139 08/03/2023 0908   K 4.0 08/03/2023 0908   CL 104 08/03/2023 0908   CO2 22 08/03/2023 0908   GLUCOSE 82 08/03/2023 0908   GLUCOSE 88 04/03/2013 0740   BUN 12 08/03/2023 0908   CREATININE 0.70 08/03/2023 0908   CREATININE 0.85 04/03/2013 0740   CALCIUM 9.0 08/03/2023 0908   PROT 6.5 08/03/2023 0908   ALBUMIN 4.2 08/03/2023 0908   AST 18 08/03/2023 0908   ALT 21 08/03/2023 0908   ALKPHOS 53 08/03/2023 0908   BILITOT 0.7 08/03/2023 0908   GFRNONAA 82 05/23/2020 0852   GFRAA 94 05/23/2020 0852     Diabetic Labs (most recent): Lab Results  Component Value Date   HGBA1C 5.0 08/05/2023    HGBA1C 5.1 01/22/2021   HGBA1C 5.1%  11/19/2017     Lipid Panel ( most recent) Lipid Panel     Component Value Date/Time   CHOL 222 (H) 07/28/2022 1016   TRIG 103 07/28/2022 1016   HDL 57 07/28/2022 1016   CHOLHDL 3.9 07/28/2022 1016   CHOLHDL 4.3 04/03/2013 0740   VLDL 24 04/03/2013 0740   LDLCALC 147 (H) 07/28/2022 1016   LABVLDL 18 07/28/2022 1016     Recent Results (from the past 2160 hours)  TSH     Status: None   Collection Time: 08/03/23  9:08 AM  Result Value Ref Range   TSH 1.790 0.450 - 4.500 uIU/mL  T4, Free     Status: None   Collection Time: 08/03/23  9:08 AM  Result Value Ref Range   Free T4 1.07 0.82 - 1.77 ng/dL  T3, Free     Status: None   Collection Time: 08/03/23  9:08 AM  Result Value Ref Range   T3, Free 2.9 2.0 - 4.4 pg/mL  Comprehensive metabolic panel     Status: None   Collection Time: 08/03/23  9:08 AM  Result Value Ref Range   Glucose 82 70 - 99 mg/dL   BUN 12 8 - 27 mg/dL   Creatinine, Ser 1.61 0.57 - 1.00 mg/dL   eGFR 98 >09 UE/AVW/0.98   BUN/Creatinine Ratio 17 12 - 28   Sodium 139 134 - 144 mmol/L   Potassium 4.0 3.5 - 5.2 mmol/L   Chloride 104 96 - 106 mmol/L   CO2 22 20 - 29 mmol/L   Calcium 9.0 8.7 - 10.3 mg/dL   Total Protein 6.5 6.0 - 8.5 g/dL   Albumin 4.2 3.9 - 4.9 g/dL   Globulin, Total 2.3 1.5 - 4.5 g/dL   Bilirubin Total 0.7 0.0 - 1.2 mg/dL   Alkaline Phosphatase 53 44 - 121 IU/L   AST 18 0 - 40 IU/L   ALT 21 0 - 32 IU/L  HgB A1c     Status: None   Collection Time: 08/05/23 11:14 AM  Result Value Ref Range   Hemoglobin A1C 5.0 4.0 - 5.6 %   HbA1c POC (<> result, manual entry)     HbA1c, POC (prediabetic range)     HbA1c, POC (controlled diabetic range)      Jan 11, 2020 thyroid ultrasound: Right lobe measured 5.6 cm x 1.6 cm x 1.4 cm with no nodules Left lobe measures 6.4 cm x 1.9 cm x 1.9 cm with 1 cm nonsuspicious nodule in the mid section.   Thyroid ultrasound on 07/30/2022  IMPRESSION: 1. Previously  described left mid thyroid nodule appears spongiform on current examination and does not require further imaging follow-up. 2. Additional subcentimeter left mid thyroid nodule does not meet criteria for FNA or surveillance.  Assessment & Plan:   1.  Nodular goiter  2.  Dyslipidemia 3.  Vitamin D deficiency: She is advised to continue vitamin D3 2000 units p.o. daily. -I discussed her recent labs and thyroid ultrasound findings with her.  Her  presentation is consistent with euthyroid state.   She will not need intervention for thyroid hormone or antithyroid medications.    Prior to her last visit thyroid ultrasound to monitor the previously documented small nonsuspicious thyroid nodule .  She presents with weight gain. She remains a candidate for Lifestyle Medicine.  In light of her severe dyslipidemia , she is advised to continue  Crestor 10mg  po qhs.  - she acknowledges that there is a room for improvement  in her food and drink choices. - Suggestion is made for her to avoid simple carbohydrates  from her diet including Cakes, Sweet Desserts, Ice Cream, Soda (diet and regular), Sweet Tea, Candies, Chips, Cookies, Store Bought Juices, Alcohol , Artificial Sweeteners,  Coffee Creamer, and "Sugar-free" Products, Lemonade. This will help patient to have more stable blood glucose profile and potentially avoid unintended weight gain.  The following Lifestyle Medicine recommendations according to American College of Lifestyle Medicine  [facility]) were discussed and and offered to patient and she  agrees to start the journey:  A. Whole Foods, Plant-Based Nutrition comprising of fruits and vegetables, plant-based proteins, whole-grain carbohydrates was discussed in detail with the patient.   A list for source of those nutrients were also provided to the patient.  Patient will use only water or unsweetened tea for hydration. B.  The need to stay away from risky substances including alcohol, smoking;  obtaining 7 to 9 hours of restorative sleep, at least 150 minutes of moderate intensity exercise weekly, the importance of healthy social connections,  and stress management techniques were discussed. C.  A full color page of  Calorie density of various food groups per pound showing examples of each food groups was provided to the patient.   - she is advised to maintain close follow up with Madison Sciara, MD for primary care needs.   I spent  22 minutes in the care of the patient today including review of labs from Thyroid Function, CMP, and other relevant labs ; imaging/biopsy records (current and previous including abstractions from other facilities); face-to-face time discussing  her lab results and symptoms, medications doses, her options of short and long term treatment based on the latest standards of care / guidelines;   and documenting the encounter.  Madison Reyes  participated in the discussions, expressed understanding, and voiced agreement with the above plans.  All questions were answered to her satisfaction. she is encouraged to contact clinic should she have any questions or concerns prior to her return visit.     Follow up plan: Return in about 1 year (around 08/04/2024) for Fasting Labs  in AM B4 8.   Marquis Lunch, MD [facility] [address] [place], [place] [address] Phone: [phone]  Fax: [phone]     08/05/2023, 7:25 PM  This note was partially dictated with voice recognition software. Similar sounding words can be transcribed inadequately or may not  be corrected upon review.

## 2023-08-05 NOTE — Patient Instructions (Signed)
                                     Advice for Weight Management  -For most of us the best way to lose weight is by diet management. Generally speaking, diet management means consuming less calories intentionally which over time brings about progressive weight loss.  This can be achieved more effectively by avoiding ultra processed carbohydrates, processed meats, unhealthy fats.    It is critically important to know your numbers: how much calorie you are consuming and how much calorie you need. More importantly, our carbohydrates sources should be unprocessed naturally occurring  complex starch food items.  It is always important to balance nutrition also by  appropriate intake of proteins (mainly plant-based), healthy fats/oils, plenty of fruits and vegetables.   -The American College of Lifestyle Medicine (ACL M) recommends nutrition derived mostly from Whole Food, Plant Predominant Sources example an apple instead of applesauce or apple pie. Eat Plenty of vegetables, Mushrooms, fruits, Legumes, Whole Grains, Nuts, seeds in lieu of processed meats, processed snacks/pastries red meat, poultry, eggs.  Use only water or unsweetened tea for hydration.  The College also recommends the need to stay away from risky substances including alcohol, smoking; obtaining 7-9 hours of restorative sleep, at least 150 minutes of moderate intensity exercise weekly, importance of healthy social connections, and being mindful of stress and seek help when it is overwhelming.    -Sticking to a routine mealtime to eat 3 meals a day and avoiding unnecessary snacks is shown to have a big role in weight control. Under normal circumstances, the only time we burn stored energy is when we are hungry, so allow  some hunger to take place- hunger means no food between appropriate meal times, only water.  It is not advisable to starve.   -It is better to avoid simple carbohydrates including:  Cakes, Sweet Desserts, Ice Cream, Soda (diet and regular), Sweet Tea, Candies, Chips, Cookies, Store Bought Juices, Alcohol in Excess of  1-2 drinks a day, Lemonade,  Artificial Sweeteners, Doughnuts, Coffee Creamers, "Sugar-free" Products, etc, etc.  This is not a complete list.....    -Consulting with certified diabetes educators is proven to provide you with the most accurate and current information on diet.  Also, you may be  interested in discussing diet options/exchanges , we can schedule a visit with Madison Reyes, RDN, CDE for individualized nutrition education.  -Exercise: If you are able: 30 -60 minutes a day ,4 days a week, or 150 minutes of moderate intensity exercise weekly.    The longer the better if tolerated.  Combine stretch, strength, and aerobic activities.  If you were told in the past that you have high risk for cardiovascular diseases, or if you are currently symptomatic, you may seek evaluation by your heart doctor prior to initiating moderate to intense exercise programs.                                  Additional Care Considerations for Diabetes/Prediabetes   -Diabetes  is a chronic disease.  The most important care consideration is regular follow-up with your diabetes care provider with the goal being avoiding or delaying its complications and to take advantage of advances in medications and technology.  If appropriate actions are taken early enough, type 2 diabetes can even be   reversed.  Seek information from the right source.  - Whole Food, Plant Predominant Nutrition is highly recommended: Eat Plenty of vegetables, Mushrooms, fruits, Legumes, Whole Grains, Nuts, seeds in lieu of processed meats, processed snacks/pastries red meat, poultry, eggs as recommended by American College of  Lifestyle Medicine (ACLM).  -Type 2 diabetes is known to coexist with other important comorbidities such as high blood pressure and high cholesterol.  It is critical to control not only the  diabetes but also the high blood pressure and high cholesterol to minimize and delay the risk of complications including coronary artery disease, stroke, amputations, blindness, etc.  The good news is that this diet recommendation for type 2 diabetes is also very helpful for managing high cholesterol and high blood blood pressure.  - Studies showed that people with diabetes will benefit from a class of medications known as ACE inhibitors and statins.  Unless there are specific reasons not to be on these medications, the standard of care is to consider getting one from these groups of medications at an optimal doses.  These medications are generally considered safe and proven to help protect the heart and the kidneys.    - People with diabetes are encouraged to initiate and maintain regular follow-up with eye doctors, foot doctors, dentists , and if necessary heart and kidney doctors.     - It is highly recommended that people with diabetes quit smoking or stay away from smoking, and get yearly  flu vaccine and pneumonia vaccine at least every 5 years.  See above for additional recommendations on exercise, sleep, stress management , and healthy social connections.      

## 2023-08-10 ENCOUNTER — Ambulatory Visit (HOSPITAL_BASED_OUTPATIENT_CLINIC_OR_DEPARTMENT_OTHER): Payer: Commercial Managed Care - PPO | Admitting: Orthopaedic Surgery

## 2023-08-12 ENCOUNTER — Other Ambulatory Visit (HOSPITAL_COMMUNITY): Payer: Self-pay | Attending: Medical Genetics

## 2023-08-25 ENCOUNTER — Ambulatory Visit: Payer: Commercial Managed Care - PPO | Admitting: Podiatry

## 2023-08-25 ENCOUNTER — Ambulatory Visit (HOSPITAL_BASED_OUTPATIENT_CLINIC_OR_DEPARTMENT_OTHER): Payer: Commercial Managed Care - PPO | Admitting: Student

## 2023-08-25 ENCOUNTER — Encounter (HOSPITAL_BASED_OUTPATIENT_CLINIC_OR_DEPARTMENT_OTHER): Payer: Self-pay | Admitting: Student

## 2023-08-25 DIAGNOSIS — M5441 Lumbago with sciatica, right side: Secondary | ICD-10-CM | POA: Diagnosis not present

## 2023-08-25 NOTE — Progress Notes (Signed)
 Chief Complaint: Right hip and low back pain     History of Present Illness:   08/25/23: Madison Reyes presents today for follow-up of her low back and right hip has had ongoing pain for the last 2 weeks which travels from the right side of the low back down the right thigh and into the lower leg.  This is associated with some numbness and tingling.  Rates pain today at an 8/10 and has been taking Tylenol  and ibuprofen.  Does note increased episodes recently of her right hip catching and getting stuck.  Has known osteoarthritis of the right hip and had a cortisone injection on 07/12/2023.   Madison Reyes is a pleasant 63 y.o. female here today for evaluation after a fall onto her right side on 07/20/2023.  I did see her 8 days prior on 11/19 for an intra-articular right hip injection which she states had been giving her great relief until her fall.  Patient was holding her grandson and fell backwards onto her right buttock onto hardwood floor.  Today she reports pain on the lateral side of the right hip, and the low back, as well as her neck.  She does have some numbness and tingling down the lateral right thigh.  Denies any groin pain.  No radiating pain into the arms.  Surgical History:   None  PMH/PSH/Family History/Social History/Meds/Allergies:    Past Medical History:  Diagnosis Date   Anxiety    Fibroids    h/o   H/O dysmenorrhea    H/O menorrhagia    High cholesterol    Menopausal symptoms    h/o   Migraine    Past Surgical History:  Procedure Laterality Date   ABDOMINAL HYSTERECTOMY     BACK SURGERY  2015   disc removal   LIPOMA EXCISION     MYOMECTOMY     ovarian fibroid     REDUCTION MAMMAPLASTY Bilateral    2022   TOTAL HIP ARTHROPLASTY Left 01/2021   UTERINE FIBROID SURGERY     Social History   Socioeconomic History   Marital status: Married    Spouse name: Not on file   Number of children: 1   Years of education: Not on  file   Highest education level: Bachelor's degree (e.g., BA, AB, BS)  Occupational History    Employer: [employer]  Tobacco Use   Smoking status: Never   Smokeless tobacco: Never  Substance and Sexual Activity   Alcohol use: Never   Drug use: Never   Sexual activity: Yes    Birth control/protection: None    Comment: no cycles; pt had hysterectomy  Other Topics Concern   Not on file  Social History Narrative   Lives at home with husband Ricky   Caffeine: none   Social Drivers of Corporate Investment Banker Strain: Not on file  Food Insecurity: No Food Insecurity (11/12/2020)   [facility] from [facility] prior to 10/23/2022., [facility] visits prior to 10/23/2022.   Hunger Vital Sign    Worried About Running Out of Food in the Last Year: Never true    Ran Out of Food in the Last Year: Never true  Transportation Needs: Not on file  Physical Activity: Not on file  Stress: Not  on file  Social Connections: Not on file   Family History  Problem Relation Age of Onset   Stroke Paternal Grandfather    Pneumonia Paternal Grandmother    Cancer Father        bone marrow cancer   Prostate cancer Father    COPD Father    Heart disease Father    Dementia Mother    Hypertension Mother    Heart disease Mother    Cancer Sister        uterine   Breast cancer Neg Hx    Allergies  Allergen Reactions   Aspirin     stomach sensitivity   Current Outpatient Medications  Medication Sig Dispense Refill   buPROPion  (WELLBUTRIN  XL) 300 MG 24 hr tablet Take 1 tablet (300 mg total) by mouth daily. 30 tablet 0   CALCIUM  PO Take by mouth.     Cholecalciferol (VITAMIN D3) 50 MCG (2000 UT) TABS Take by mouth.     escitalopram  (LEXAPRO ) 10 MG tablet TAKE 1 TABLET DAILY 90 tablet 1   fluticasone  (FLONASE ) 50 MCG/ACT nasal spray Place 2 sprays into both nostrils daily. 16 g 5   hydrochlorothiazide  (HYDRODIURIL ) 25 MG tablet Take  1/2-1 tab po qd prn swelling 30 tablet 0   Multiple Vitamins-Minerals (MULTIVITAMIN PO) Take by mouth.     rosuvastatin  (CRESTOR ) 10 MG tablet TAKE 1 TABLET DAILY 90 tablet 3   No current facility-administered medications for this visit.   No results found.  Review of Systems:   A ROS was performed including pertinent positives and negatives as documented in the HPI.  Physical Exam :   Constitutional: NAD and appears stated age Neurological: Alert and oriented Psych: Appropriate affect and cooperative There were no vitals taken for this visit.   Comprehensive Musculoskeletal Exam:    Tenderness throughout the musculature in the right lumbar region.  No greater trochanter tenderness of the right hip.  Passive right hip range of motion to 100 degrees flexion, 20 degrees external rotation, and 10 degrees internal rotation.  Imaging:    Assessment:   63 y.o. female presenting with symptoms consistent with lumbar radiculopathy as this travels down  an L4 pattern toward the right foot.  I do believe she would benefit from physical therapy to help address this.  She does have known osteoarthritis in the right hip which does seem to be causing her some mechanical issues, however she has had her left hip replaced and wants to avoid replacement surgery if possible.  Can repeat intra-articular cortisone injection on 2/19.  Will plan to see her back 3 to 4 weeks after she is able to begin physical therapy to assess progress.  Plan :    -Referral to physical therapy for low back pain and radiculopathy -Return to clinic in 4 to 6 weeks for reassessment    I personally saw and evaluated the patient, and participated in the management and treatment plan.   Leonce Reveal, PA-C Orthopedics

## 2023-08-27 ENCOUNTER — Other Ambulatory Visit: Payer: Self-pay | Admitting: Family Medicine

## 2023-08-27 LAB — LAB REPORT - SCANNED
A1c: 5.1
EGFR (African American): 81
TSH: 1.82 (ref 0.41–5.90)

## 2023-09-20 ENCOUNTER — Encounter: Payer: Self-pay | Admitting: Nurse Practitioner

## 2023-09-29 ENCOUNTER — Institutional Professional Consult (permissible substitution): Payer: Commercial Managed Care - PPO | Admitting: Neurology

## 2023-09-30 ENCOUNTER — Other Ambulatory Visit: Payer: Self-pay | Admitting: Obstetrics and Gynecology

## 2023-09-30 DIAGNOSIS — Z1231 Encounter for screening mammogram for malignant neoplasm of breast: Secondary | ICD-10-CM

## 2023-10-04 ENCOUNTER — Other Ambulatory Visit: Payer: Self-pay | Admitting: Family Medicine

## 2023-10-04 MED ORDER — FLUTICASONE PROPIONATE 50 MCG/ACT NA SUSP: 2.0000 | Freq: Every day | NASAL | 5 refills | Status: AC

## 2023-10-19 ENCOUNTER — Encounter: Payer: Self-pay | Admitting: Family Medicine

## 2023-10-19 ENCOUNTER — Ambulatory Visit (INDEPENDENT_AMBULATORY_CARE_PROVIDER_SITE_OTHER): Payer: Commercial Managed Care - PPO | Admitting: Family Medicine

## 2023-10-19 VITALS — BP 128/74 | HR 64 | Temp 97.7°F | Ht 67.0 in | Wt 183.0 lb

## 2023-10-19 DIAGNOSIS — G4440 Drug-induced headache, not elsewhere classified, not intractable: Secondary | ICD-10-CM | POA: Diagnosis not present

## 2023-10-19 DIAGNOSIS — J029 Acute pharyngitis, unspecified: Secondary | ICD-10-CM | POA: Diagnosis not present

## 2023-10-19 DIAGNOSIS — T3995XA Adverse effect of unspecified nonopioid analgesic, antipyretic and antirheumatic, initial encounter: Secondary | ICD-10-CM | POA: Diagnosis not present

## 2023-10-19 DIAGNOSIS — R0981 Nasal congestion: Secondary | ICD-10-CM | POA: Diagnosis not present

## 2023-10-19 LAB — POCT RAPID STREP A (OFFICE): Rapid Strep A Screen: NEGATIVE

## 2023-10-19 MED ORDER — AZELASTINE HCL 0.1 % NA SOLN: 2.0000 | Freq: Two times a day (BID) | NASAL | 12 refills | Status: DC

## 2023-10-19 NOTE — Progress Notes (Signed)
   Subjective:    Patient ID: Madison Reyes, female    DOB: 07-30-1961, 63 y.o.   MRN: [id number]  HPI  Patient presents today with respiratory illness Number of days present-been off and on for several weeks and to some degree several months  Symptoms include-head congestion drainage sinus pressure in the forehead frequent headaches in the top of the head and sometimes sides of the head no nausea vomiting or blurred vision with that  Presence of worrisome signs (severe shortness of breath, lethargy, etc.) -denies any wheezing or difficulty breathing does relate sore throat she states she sees some white areas on the back of the right side  Recent/current visit to urgent care or ER-none  Recent direct exposure to Covid-none  Any current Covid testing-none  Frequent headaches been going on for over 6 months over the past several months she has gotten to the point of taking either Tylenol ibuprofen or Aleve almost on every evening basis to help with her headaches She relates she is under a lot of stress Denies being depressed Worries a lot about her son who lives away from home and has children Patient denies any vomiting with a headache no double vision or blurred vision headaches do not wake her up in the middle of the night  Review of Systems     Objective:   Physical Exam General-in no acute distress Eyes-no discharge Lungs-respiratory rate normal, CTA CV-no murmurs,RRR Extremities skin warm dry no edema Neuro grossly normal Behavior normal, alert Eardrums are normal sinuses nontender neck no masses       Assessment & Plan:   1. Sore throat (Primary) Strep negative no apparent infection noted she does have a sebaceous gland within the tonsillar region on the right side no sign of infection with this - Rapid Strep A  2. Analgesic rebound headache We did discuss the nature this offered various pathways patient would like to see neurology for further evaluation we  will help set this up - Ambulatory referral to Neurology  3. Sinus congestion OTC medications would include Claritin generic every day along with Astelin nasal spray.  Follow-up if ongoing progressive troubles If not doing better over the course of the next several weeks to let us know and we will help set up with ENT Keep up with yearly wellness

## 2023-10-20 ENCOUNTER — Encounter: Payer: Self-pay | Admitting: Family Medicine

## 2023-11-12 LAB — AMB RESULTS CONSOLE CBG: Glucose: 110

## 2023-11-12 NOTE — Progress Notes (Signed)
 Pt screened for non fasting blood sugar found to be WNL. Encouraged to keep follow up appointment with PCP

## 2023-11-18 ENCOUNTER — Ambulatory Visit (HOSPITAL_BASED_OUTPATIENT_CLINIC_OR_DEPARTMENT_OTHER): Admitting: Student

## 2023-11-18 ENCOUNTER — Encounter (HOSPITAL_BASED_OUTPATIENT_CLINIC_OR_DEPARTMENT_OTHER): Payer: Self-pay | Admitting: Student

## 2023-11-18 DIAGNOSIS — M25551 Pain in right hip: Secondary | ICD-10-CM | POA: Diagnosis not present

## 2023-11-18 DIAGNOSIS — M1611 Unilateral primary osteoarthritis, right hip: Secondary | ICD-10-CM | POA: Diagnosis not present

## 2023-11-18 MED ORDER — LIDOCAINE HCL 1 % IJ SOLN
4.0000 mL | INTRAMUSCULAR | Status: AC | PRN
  Administered 2023-11-18: 4 mL

## 2023-11-18 MED ORDER — TRIAMCINOLONE ACETONIDE 40 MG/ML IJ SUSP
2.0000 mL | INTRAMUSCULAR | Status: AC | PRN
  Administered 2023-11-18: 2 mL via INTRA_ARTICULAR

## 2023-11-18 NOTE — Progress Notes (Signed)
 Chief Complaint: Right hip pain     History of Present Illness:   11/18/23: Patient presents today for follow-up of right hip pain.  She does have a known history of osteoarthritis in the right hip and has had cortisone injections, last was on 07/12/2023 which did give her good relief.  She is experiencing some pain in the hip as well as occasional catching.  She is also currently working with physical therapy for low back pain.   08/25/23: Madison Reyes presents today for follow-up of her low back and right hip has had ongoing pain for the last 2 weeks which travels from the right side of the low back down the right thigh and into the lower leg.  This is associated with some numbness and tingling.  Rates pain today at an 8/10 and has been taking Tylenol and ibuprofen.  Does note increased episodes recently of her right hip catching and "getting stuck".  Has known osteoarthritis of the right hip and had a cortisone injection on 07/12/2023.  Surgical History:   None  PMH/PSH/Family History/Social History/Meds/Allergies:    Past Medical History:  Diagnosis Date   Anxiety    Fibroids    h/o   H/O dysmenorrhea    H/O menorrhagia    High cholesterol    Menopausal symptoms    h/o   Migraine    Past Surgical History:  Procedure Laterality Date   ABDOMINAL HYSTERECTOMY     BACK SURGERY  2015   disc removal   LIPOMA EXCISION     MYOMECTOMY     ovarian fibroid     REDUCTION MAMMAPLASTY Bilateral    2022   TOTAL HIP ARTHROPLASTY Left 01/2021   UTERINE FIBROID SURGERY     Social History   Socioeconomic History   Marital status: Married    Spouse name: Not on file   Number of children: 1   Years of education: Not on file   Highest education level: Bachelor's degree (e.g., BA, AB, BS)  Occupational History    Employer: [employer]  Tobacco Use   Smoking status: Never   Smokeless tobacco: Never  Substance and Sexual Activity    Alcohol use: Never   Drug use: Never   Sexual activity: Yes    Birth control/protection: None    Comment: no cycles; pt had hysterectomy  Other Topics Concern   Not on file  Social History Narrative   Lives at home with husband Ricky   Caffeine: none   Social Drivers of Corporate investment banker Strain: Not on file  Food Insecurity: No Food Insecurity (11/12/2023)   Hunger Vital Sign    Worried About Running Out of Food in the Last Year: Never true    Ran Out of Food in the Last Year: Never true  Transportation Needs: No Transportation Needs (11/12/2023)   PRAPARE - Administrator, Civil Service (Medical): No    Lack of Transportation (Non-Medical): No  Physical Activity: Not on file  Stress: Not on file  Social Connections: Not on file   Family History  Problem Relation Age of Onset   Stroke Paternal Grandfather    Pneumonia Paternal Grandmother    Cancer Father        bone marrow cancer   Prostate cancer Father  COPD Father    Heart disease Father    Dementia Mother    Hypertension Mother    Heart disease Mother    Cancer Sister        uterine   Breast cancer Neg Hx    Allergies  Allergen Reactions   Aspirin     "stomach sensitivity"   Current Outpatient Medications  Medication Sig Dispense Refill   azelastine (ASTELIN) 0.1 % nasal spray Place 2 sprays into both nostrils 2 (two) times daily. 30 mL 12   buPROPion (WELLBUTRIN XL) 300 MG 24 hr tablet TAKE 1 TABLET BY MOUTH EVERY DAY 90 tablet 1   escitalopram (LEXAPRO) 10 MG tablet TAKE 1 TABLET DAILY 90 tablet 1   fluticasone (FLONASE) 50 MCG/ACT nasal spray Place 2 sprays into both nostrils daily. 16 g 5   Multiple Vitamins-Minerals (MULTIVITAMIN PO) Take by mouth.     rosuvastatin (CRESTOR) 10 MG tablet TAKE 1 TABLET DAILY 90 tablet 3   No current facility-administered medications for this visit.   No results found.  Review of Systems:   A ROS was performed including pertinent positives and  negatives as documented in the HPI.  Physical Exam :   Constitutional: NAD and appears stated age Neurological: Alert and oriented Psych: Appropriate affect and cooperative There were no vitals taken for this visit.   Comprehensive Musculoskeletal Exam:    Right hip passive range of motion to 100 degrees flexion, 20 degrees external rotation, and 10 degrees internal rotation.  No tenderness over the greater trochanter.  Discomfort in the anterior hip with resisted hip flexion although strength remains full.  Imaging:    Assessment:   63 y.o. female with advanced osteoarthritis of the right hip.  She does have a history of a left total hip arthroplasty done in 2022.  Patient has responded well with prior cortisone injections and given current symptoms of pain and catching, I have offered to repeat this today in order to provide relief.  Patient and her husband are headed to the beach this weekend requiring riding in the car for a few hours at a time, so she would like to proceed with injection.  Cortisone was successfully injected into the right hip joint under ultrasound guidance and she tolerated the procedure well.  Will plan to follow-up as needed and may consider referral in the future for further discussion of hip replacement.  Plan :    -Right hip intra-articular cortisone injection given today -Return to clinic as needed      Procedure Note  Patient: Madison Reyes             Date of Birth: 08/17/1961           MRN: [id number]             Visit Date: 11/18/2023  Procedures: Visit Diagnoses:  1. Unilateral primary osteoarthritis, right hip     Large Joint Inj: R hip joint on 11/18/2023 2:15 PM Indications: pain Details: 22 G 3.5 in needle, anterior approach Medications: 4 mL lidocaine 1 %; 2 mL triamcinolone acetonide 40 MG/ML Outcome: tolerated well, no immediate complications Procedure, treatment alternatives, risks and benefits explained, specific risks discussed.  Consent was given by the patient. Immediately prior to procedure a time out was called to verify the correct patient, procedure, equipment, support staff and site/side marked as required. Patient was prepped and draped in the usual sterile fashion.     I personally saw and evaluated the patient, and  participated in the management and treatment plan.   Hazle Nordmann, PA-C Orthopedics

## 2023-12-06 ENCOUNTER — Encounter (HOSPITAL_BASED_OUTPATIENT_CLINIC_OR_DEPARTMENT_OTHER): Payer: Self-pay | Admitting: Student

## 2023-12-06 ENCOUNTER — Ambulatory Visit (INDEPENDENT_AMBULATORY_CARE_PROVIDER_SITE_OTHER): Admitting: Student

## 2023-12-06 DIAGNOSIS — M1611 Unilateral primary osteoarthritis, right hip: Secondary | ICD-10-CM | POA: Diagnosis not present

## 2023-12-06 NOTE — Progress Notes (Signed)
 Chief Complaint: Right hip pain     History of Present Illness:   12/06/23: Patient presents the clinic today following up on her right hip.  She reports that hip injection performed at last visit did give her some relief, particularly discomfort.  She reports little to no pain today.  She does report however that her hip has continued to "get stuck" at least once per day.  This happens more often in the seated position.  Taking Aleve and extra strength Tylenol for pain.   11/18/2023: Patient presents today for follow-up of right hip pain.  She does have a known history of osteoarthritis in the right hip and has had cortisone injections, last was on 07/12/2023 which did give her good relief.  She is experiencing some pain in the hip as well as occasional catching.  She is also currently working with physical therapy for low back pain.  Surgical History:   None  PMH/PSH/Family History/Social History/Meds/Allergies:    Past Medical History:  Diagnosis Date   Anxiety    Fibroids    h/o   H/O dysmenorrhea    H/O menorrhagia    High cholesterol    Menopausal symptoms    h/o   Migraine    Past Surgical History:  Procedure Laterality Date   ABDOMINAL HYSTERECTOMY     BACK SURGERY  2015   disc removal   LIPOMA EXCISION     MYOMECTOMY     ovarian fibroid     REDUCTION MAMMAPLASTY Bilateral    2022   TOTAL HIP ARTHROPLASTY Left 01/2021   UTERINE FIBROID SURGERY     Social History   Socioeconomic History   Marital status: Married    Spouse name: Not on file   Number of children: 1   Years of education: Not on file   Highest education level: Bachelor's degree (e.g., BA, AB, BS)  Occupational History    Employer: [employer]  Tobacco Use   Smoking status: Never   Smokeless tobacco: Never  Substance and Sexual Activity   Alcohol use: Never   Drug use: Never   Sexual activity: Yes    Birth control/protection: None     Comment: no cycles; pt had hysterectomy  Other Topics Concern   Not on file  Social History Narrative   Lives at home with husband Ricky   Caffeine: none   Social Drivers of Corporate investment banker Strain: Not on file  Food Insecurity: No Food Insecurity (11/12/2023)   Hunger Vital Sign    Worried About Running Out of Food in the Last Year: Never true    Ran Out of Food in the Last Year: Never true  Transportation Needs: No Transportation Needs (11/12/2023)   PRAPARE - Administrator, Civil Service (Medical): No    Lack of Transportation (Non-Medical): No  Physical Activity: Not on file  Stress: Not on file  Social Connections: Not on file   Family History  Problem Relation Age of Onset   Stroke Paternal Grandfather    Pneumonia Paternal Grandmother    Cancer Father        bone marrow cancer   Prostate cancer Father    COPD Father    Heart disease Father    Dementia Mother    Hypertension Mother  Heart disease Mother    Cancer Sister        uterine   Breast cancer Neg Hx    Allergies  Allergen Reactions   Aspirin     "stomach sensitivity"   Current Outpatient Medications  Medication Sig Dispense Refill   azelastine (ASTELIN) 0.1 % nasal spray Place 2 sprays into both nostrils 2 (two) times daily. 30 mL 12   buPROPion (WELLBUTRIN XL) 300 MG 24 hr tablet TAKE 1 TABLET BY MOUTH EVERY DAY 90 tablet 1   escitalopram (LEXAPRO) 10 MG tablet TAKE 1 TABLET DAILY 90 tablet 1   fluticasone (FLONASE) 50 MCG/ACT nasal spray Place 2 sprays into both nostrils daily. 16 g 5   Multiple Vitamins-Minerals (MULTIVITAMIN PO) Take by mouth.     rosuvastatin (CRESTOR) 10 MG tablet TAKE 1 TABLET DAILY 90 tablet 3   No current facility-administered medications for this visit.   No results found.  Review of Systems:   A ROS was performed including pertinent positives and negatives as documented in the HPI.  Physical Exam :   Constitutional: NAD and appears stated  age Neurological: Alert and oriented Psych: Appropriate affect and cooperative There were no vitals taken for this visit.   Comprehensive Musculoskeletal Exam:    Right hip passive range of motion to 100 degrees flexion, 20 degrees external rotation, and 10 degrees internal rotation.  No tenderness with palpation over the greater trochanter.  Imaging:    Assessment:   63 y.o. female with advanced osteoarthritis of the right hip joint.  Patient underwent a left total hip arthroplasty in 2022 with Dr. Carry Clapper which she states went well other than some residual numbness in the thigh.  She does continue to have some more mechanical type symptoms of her hip popping and getting stuck, although pain levels are well-controlled due to recent cortisone injection.  At this point she has undergone multiple injections as well as physical therapy and reports that her symptoms are interfering with activities and travel that she would like to do.  Given this I have recommended an appointment with Dr. Lucienne Ryder for evaluation and to discuss possible treatment options, particularly if this may involve total hip arthroplasty.  Plan :    - Referral to Dr. Lucienne Ryder for further discussion of treatment options for right hip osteoarthritis      I personally saw and evaluated the patient, and participated in the management and treatment plan.   Sharrell Deck, PA-C Orthopedics

## 2023-12-16 NOTE — Progress Notes (Signed)
 Pt attended 11/12/23 screening event with blood sugar was 110. Pt noted at event that she does have a PCP. At event pt did not indicate any SDOH needs. Pt also noted that he is not a smoker and listed Medicaid as his insurance at the event.   Per chart review pt does have a PCP (Scott Fairy Homer; [facility]), insurance, and is not a smoker. Pt's last appt with 10/19/2023. Pt does not indicate any SDOH needs at this time.  No additional pt f/u to be scheduled at this time per health equity protocol.

## 2023-12-22 ENCOUNTER — Encounter: Payer: Self-pay | Admitting: Orthopaedic Surgery

## 2023-12-22 ENCOUNTER — Ambulatory Visit: Admitting: Orthopaedic Surgery

## 2023-12-22 ENCOUNTER — Other Ambulatory Visit (INDEPENDENT_AMBULATORY_CARE_PROVIDER_SITE_OTHER): Payer: Self-pay

## 2023-12-22 ENCOUNTER — Ambulatory Visit
Admission: RE | Admit: 2023-12-22 | Discharge: 2023-12-22 | Disposition: A | Discharge status: Home / Self Care | Source: Ambulatory Visit | Attending: Obstetrics and Gynecology | Admitting: Obstetrics and Gynecology

## 2023-12-22 DIAGNOSIS — Z1231 Encounter for screening mammogram for malignant neoplasm of breast: Secondary | ICD-10-CM

## 2023-12-22 DIAGNOSIS — Z96642 Presence of left artificial hip joint: Secondary | ICD-10-CM | POA: Diagnosis not present

## 2023-12-22 DIAGNOSIS — M1611 Unilateral primary osteoarthritis, right hip: Secondary | ICD-10-CM

## 2023-12-22 NOTE — Progress Notes (Signed)
 The patient is a very pleasant and active 63 year old female who is here today with her husband to discuss her right hip.  She actually has known arthritis in that right hip and it is painful for her but she is not sure about proceeding with any type of hip replacement as of yet.  She actually has a history of a left direct anterior total hip arthroplasty that was performed here in [place] in 2021 by one of my colleagues in town.  She says that hip is still caused her to have some pain and that is why she is reluctant with proceeding with hip replacement surgery which is certainly reasonable.  She does have times where her right hip locks on her and she has to not put weight on it and then can eventually put weight on it once that locking and grabbing sensation in the groin area has subsided.  Examination of her right hip shows stiffness with internal and external rotation but no blocks to rotation but there is pain in the groin with rotation.  Her left operative hip moves smoothly and fluidly.  An AP pelvis and lateral of the right hip shows a well-seated left total hip arthroplasty.  The right hip does have significant degenerative joint disease with osteoarthritis including significant joint space narrowing and osteophytes.  We did talk in length in detail about hip replacement surgery again.  She is very familiar with this having had her left hip replaced.  She is still wanting to hold off on any type of surgery until he gets to where it is detrimentally affecting her mobility, her quality of life and actives daily living on a daily basis.  All questions and concerns were answered and addressed.  She is welcome to reach out to us  at any time for further questions or even considering hip replacement surgery for when she is ready.

## 2024-01-03 ENCOUNTER — Ambulatory Visit (HOSPITAL_BASED_OUTPATIENT_CLINIC_OR_DEPARTMENT_OTHER): Admitting: Student

## 2024-01-03 ENCOUNTER — Telehealth: Payer: Self-pay | Admitting: Orthopaedic Surgery

## 2024-01-03 NOTE — Telephone Encounter (Signed)
 Patient's husband called. Would like to speak with someone about what's next. Does she need to schedule surgery? His cb# [phone]

## 2024-01-04 ENCOUNTER — Telehealth: Payer: Self-pay

## 2024-01-04 ENCOUNTER — Telehealth (HOSPITAL_BASED_OUTPATIENT_CLINIC_OR_DEPARTMENT_OTHER): Payer: Self-pay | Admitting: Student

## 2024-01-04 NOTE — Telephone Encounter (Signed)
 Want to see her again? Or just schedule surgery since you guys discussed it?  She is calling stating that her pain has increased and painful to walk Husband#[phone]

## 2024-01-04 NOTE — Telephone Encounter (Signed)
 Patient states that she is in a lot of pain and she has called Dr Koren Persons office  and they told her that he was in surgery but no one has contacted her and told her to call back to [facility] to talk to [facility] because there is nothing available. Patient states that she is just in a lot of pain and would a response on what to do. Best contact husband [phone]

## 2024-01-05 NOTE — Telephone Encounter (Signed)
 Did you talk to her yesterday?

## 2024-01-11 ENCOUNTER — Telehealth: Payer: Self-pay | Admitting: *Deleted

## 2024-01-11 ENCOUNTER — Encounter: Payer: Self-pay | Admitting: *Deleted

## 2024-01-11 NOTE — Telephone Encounter (Signed)
 I called patient regarding scheduling surgery.  She stated she has decided to have surgeon that did her other hip do this surgery.

## 2024-01-11 NOTE — Telephone Encounter (Signed)
 Copied from CRM [id number]. Topic: Appointments - Appointment Scheduling >> Jan 10, 2024  4:00 PM Tiffany S wrote: Patient/patient representative is calling to schedule an appointment. Refer to attachments for appointment information.   Patient wanted to know if she needs to been by Dr Fairy Homer for clearance for hip surgery please follow up with patient

## 2024-01-11 NOTE — Telephone Encounter (Signed)
 My chart message sent to patient stating patient would need to be seen in person for presurgical exam and clearance. Patient has appointment scheduled at 8:20 am in the morning.

## 2024-01-12 ENCOUNTER — Ambulatory Visit (INDEPENDENT_AMBULATORY_CARE_PROVIDER_SITE_OTHER): Admitting: Family Medicine

## 2024-01-12 VITALS — BP 122/70 | HR 69 | Temp 97.4°F | Ht 67.0 in | Wt 185.0 lb

## 2024-01-12 DIAGNOSIS — F419 Anxiety disorder, unspecified: Secondary | ICD-10-CM

## 2024-01-12 DIAGNOSIS — M1611 Unilateral primary osteoarthritis, right hip: Secondary | ICD-10-CM

## 2024-01-12 MED ORDER — HYDROCODONE-ACETAMINOPHEN 5-325 MG PO TABS: ORAL_TABLET | ORAL | 0 refills | Status: DC

## 2024-01-12 MED ORDER — NAPROXEN 500 MG PO TABS: ORAL_TABLET | ORAL | 2 refills | Status: DC

## 2024-01-12 NOTE — Progress Notes (Signed)
 Subjective:    Patient ID: Madison Reyes, female    DOB: July 12, 1961, 63 y.o.   MRN: [id number]  HPI Right hip surgery clearance for Dr Carry Clapper with [facility] Hx of left hip surgery multiple years ago  Discussed the use of AI scribe software for clinical note transcription with the patient, who gave verbal consent to proceed.  History of Present Illness   Madison Reyes is a 63 year old female with severe osteoarthritis of the right hip who presents for pre-surgical evaluation and management of hip pain.  She has been experiencing worsening pain in her right hip, which has intensified over the past year despite receiving several cortisone injections. The pain is exacerbated by prolonged sitting, making it difficult to rise and bear weight on the hip after sitting for 30 to 45 minutes. She recalls an incident where she was unable to exit her car due to the pain, requiring assistance to enter her home.  She manages her pain with meloxicam 7.5 mg, extra strength Tylenol , and Aleve, though the combination of meloxicam and Aleve causes stomach discomfort. Ice packs are also used to help alleviate the pain.  In 2022, she underwent left hip surgery, which was extremely painful and required oxycodone for pain management. The pain persisted throughout her recovery, and she experienced numbness post-surgery. Physical therapy commenced two weeks post-surgery, facilitated by her husband.  Recent imaging, including a hip x-ray performed at the start of May, indicated the necessity for hip replacement surgery. She is concerned about her mobility, fearing falls due to her hip 'giving way' and uses a walker from her previous surgery for support. She limits her activities due to pain and fear of falling, although she tries to stay active by walking around stores with a cart for support.  She retired on March 28th and had plans to travel to [place] in June and attend her grandchild's birthday in July,  but these plans are affected by her upcoming surgery. No breathing trouble, chest pain, or other systemic symptoms. Reports anxiety related to her hip pain and upcoming surgery.     She relates that she does not have any chest tightness pressure or discomfort with walking.  Before recent flareup with a Under the past couple months she was able to do walking and activity without any cardiac symptoms.  She was exceeding 4 METS  Review of Systems     Objective:   Physical Exam  General-in no acute distress Eyes-no discharge Lungs-respiratory rate normal, CTA CV-no murmurs,RRR Extremities skin warm dry no edema Neuro grossly normal Behavior normal, alert   X-ray reviewed showed severe osteoarthritis of the right hip    Assessment & Plan:   Assessment and Plan    Severe osteoarthritis of the right hip Chronic pain and functional limitations due to severe osteoarthritis. Previous interventions ineffective. Hip replacement surgery scheduled for June. Pain managed with meloxicam, Tylenol , and Aleve. Advised against concurrent use of meloxicam and naproxen due to renal risks. Surgery deemed low risk with significant long-term benefits. - Discontinue meloxicam. - Prescribe naproxen 500 mg twice daily. - Stop all anti-inflammatories 7-8 days before surgery. - Prescribe hydrocodone  for severe pain as needed. - Recommend ice packs for pain management. - Encourage range of motion exercises and flexibility maintenance. - Advise use of walking staff for stability. - Discuss early mobility post-surgery to prevent blood clots. - Advise on nutritional intake and stool softeners to prevent constipation post-surgery.  Anxiety Anxiety exacerbated by chronic pain  and upcoming surgery. Triggered by specific situations. Behavioral approaches recommended as primary management. - Encourage behavioral strategies for anxiety management, including relaxation techniques and social support. - Discuss  potential use of medications if behavioral strategies are insufficient. - Advise caution with online resources for anxiety management.       From a presurgical assessment standpoint she is considered to be low risk assessment.  She is approved for surgery in that regards.  We will send a copy of this office note as well as cover letter to her orthopedist.

## 2024-01-13 ENCOUNTER — Other Ambulatory Visit (HOSPITAL_BASED_OUTPATIENT_CLINIC_OR_DEPARTMENT_OTHER): Payer: Self-pay

## 2024-01-18 ENCOUNTER — Telehealth: Payer: Self-pay | Admitting: Family Medicine

## 2024-01-18 NOTE — Telephone Encounter (Signed)
 I received documentation from her research group that is working with her at [facility] showed hemoglobin 13.0 MCV 99 showed creatinine 0.82 GFR 81 total cholesterol 203 LDL 122 A1c 5.1 this will be scanned into the system

## 2024-01-26 ENCOUNTER — Other Ambulatory Visit: Payer: Self-pay | Admitting: Family Medicine

## 2024-02-01 ENCOUNTER — Institutional Professional Consult (permissible substitution): Admitting: Neurology

## 2024-02-29 ENCOUNTER — Encounter (INDEPENDENT_AMBULATORY_CARE_PROVIDER_SITE_OTHER): Payer: Self-pay

## 2024-03-05 ENCOUNTER — Other Ambulatory Visit: Payer: Self-pay

## 2024-03-09 ENCOUNTER — Other Ambulatory Visit
Admission: RE | Admit: 2024-03-09 | Discharge: 2024-03-09 | Disposition: A | Discharge status: Home / Self Care | Payer: Self-pay | Source: Ambulatory Visit | Attending: Medical Genetics | Admitting: Medical Genetics

## 2024-03-09 ENCOUNTER — Encounter (INDEPENDENT_AMBULATORY_CARE_PROVIDER_SITE_OTHER): Payer: Self-pay

## 2024-03-09 DIAGNOSIS — Z006 Encounter for examination for normal comparison and control in clinical research program: Secondary | ICD-10-CM | POA: Insufficient documentation

## 2024-03-13 ENCOUNTER — Encounter: Payer: Self-pay | Admitting: Neurology

## 2024-03-20 LAB — GENECONNECT MOLECULAR SCREEN: Genetic Analysis Overall Interpretation: NEGATIVE

## 2024-03-26 ENCOUNTER — Encounter: Payer: Self-pay | Admitting: "Endocrinology

## 2024-03-26 ENCOUNTER — Other Ambulatory Visit: Payer: Self-pay

## 2024-04-17 ENCOUNTER — Encounter: Payer: Self-pay | Admitting: Neurology

## 2024-05-11 LAB — LAB REPORT - SCANNED
A1c: 5
EGFR: 93
Free T4: 5.5

## 2024-05-28 NOTE — Telephone Encounter (Signed)
 Left voicemail to reschedule appointment on 10/27.   If patient calls back, she can be rescheduled with Dr Vear

## 2024-06-14 ENCOUNTER — Encounter: Payer: Self-pay | Admitting: Family Medicine

## 2024-06-15 ENCOUNTER — Other Ambulatory Visit: Payer: Self-pay | Admitting: Nurse Practitioner

## 2024-06-15 MED ORDER — BUPROPION HCL ER (XL) 300 MG PO TB24: 300.0000 mg | ORAL_TABLET | Freq: Every day | ORAL | 1 refills | Status: DC

## 2024-06-18 ENCOUNTER — Ambulatory Visit: Admitting: Neurology

## 2024-06-21 ENCOUNTER — Ambulatory Visit: Payer: Self-pay

## 2024-06-21 NOTE — Telephone Encounter (Signed)
 FYI Only or Action Required?: FYI only for provider: appointment scheduled on 06/22/24.  Patient was last seen in primary care on 01/12/2024 by Alphonsa Glendia LABOR, MD.  Called Nurse Triage reporting Sore Throat.  Symptoms began several days ago.  Interventions attempted: Rest, hydration, or home remedies.  Symptoms are: gradually worsening.  Triage Disposition: See PCP When Office is Open (Within 3 Days)  Patient/caregiver understands and will follow disposition?: Yes    Copied from CRM #[id number]. Topic: Clinical - Red Word Triage >> Jun 21, 2024  3:34 PM Winona R wrote: Pt husband is on the line, Pt Went to urgent care about a month ago for a Virus however she is still experiencing some side affects. Sore throat, red throat, white puss spot in her throat Py husband is concerned and would like for her to be seen Reason for Disposition  [1] Sore throat with cough/cold symptoms AND [2] present > 5 days  Answer Assessment - Initial Assessment Questions Additional info: 1) Evaluated one month ago at urgent care and diagnosed with viral respiratory illness. Sore throat, cough, congestion, her symptoms resolved except sore throat, now has white pus on back of throat. Requesting evaluation.  2) Scheduled acute visit at alternate regional clinic, patient aware of address and name of provider for visit.    1. ONSET: When did the throat start hurting? (Hours or days ago)      One month ago but mostly healed and now reoccurred few days ago 2. SEVERITY: How bad is the sore throat? (Scale 1-10; mild, moderate or severe)     Moderate 3. STREP EXPOSURE: Has there been any exposure to strep within the past week? If Yes, ask: What type of contact occurred?      Unknown  4.  VIRAL SYMPTOMS: Are there any symptoms of a cold, such as a runny nose, cough, hoarse voice or red eyes?       Mild cough  5. FEVER: Do you have a fever? If Yes, ask: What is your temperature, how was it measured, and  when did it start?     denies 6. PUS ON THE TONSILS: Is there pus on the tonsils in the back of your throat?     yes 7. OTHER SYMPTOMS: Do you have any other symptoms? (e.g., difficulty breathing, headache, rash)     fatigue  Protocols used: Sore Throat-A-AH

## 2024-06-22 ENCOUNTER — Ambulatory Visit: Admitting: Family Medicine

## 2024-06-25 ENCOUNTER — Encounter: Payer: Self-pay | Admitting: Radiology

## 2024-07-16 ENCOUNTER — Telehealth: Payer: Self-pay | Admitting: Neurology

## 2024-07-16 NOTE — Telephone Encounter (Signed)
 Pt called to Cancel appt due to knee surgery   Appt Canceled

## 2024-07-23 ENCOUNTER — Ambulatory Visit: Admitting: Neurology

## 2024-07-24 ENCOUNTER — Other Ambulatory Visit: Payer: Self-pay | Admitting: Family Medicine

## 2024-07-27 ENCOUNTER — Telehealth: Payer: Self-pay | Admitting: "Endocrinology

## 2024-07-27 DIAGNOSIS — E782 Mixed hyperlipidemia: Secondary | ICD-10-CM

## 2024-07-27 DIAGNOSIS — E039 Hypothyroidism, unspecified: Secondary | ICD-10-CM

## 2024-07-27 DIAGNOSIS — E042 Nontoxic multinodular goiter: Secondary | ICD-10-CM

## 2024-07-27 NOTE — Telephone Encounter (Signed)
 Labs updated and sent to [facility].

## 2024-07-27 NOTE — Telephone Encounter (Signed)
 Pt needs labs updated

## 2024-07-30 ENCOUNTER — Other Ambulatory Visit: Payer: Self-pay | Admitting: Family Medicine

## 2024-08-06 ENCOUNTER — Ambulatory Visit: Admitting: Neurology

## 2024-08-06 ENCOUNTER — Ambulatory Visit: Admitting: Family Medicine

## 2024-08-06 VITALS — BP 124/70 | Ht 67.0 in | Wt 164.2 lb

## 2024-08-06 DIAGNOSIS — R5383 Other fatigue: Secondary | ICD-10-CM

## 2024-08-06 DIAGNOSIS — R490 Dysphonia: Secondary | ICD-10-CM

## 2024-08-06 DIAGNOSIS — J019 Acute sinusitis, unspecified: Secondary | ICD-10-CM

## 2024-08-06 MED ORDER — PANTOPRAZOLE SODIUM 40 MG PO TBEC: 40.0000 mg | DELAYED_RELEASE_TABLET | Freq: Every day | ORAL | 3 refills | Status: AC

## 2024-08-06 MED ORDER — AMOXICILLIN-POT CLAVULANATE 875-125 MG PO TABS: 1.0000 | ORAL_TABLET | Freq: Two times a day (BID) | ORAL | 0 refills | Status: AC

## 2024-08-06 NOTE — Progress Notes (Signed)
° °  Subjective:    Patient ID: Madison Reyes, female    DOB: Mar 16, 1961, 63 y.o.   MRN: [id number]  HPI Patient has had 8 weeks of head congestion drainage coughing sore hoarseness not feeling good at times she feels under the weather other times not as bad she denies high fever chill sweats denies wheezing difficulty breathing Does relate postnasal drainage also relates hoarseness saw ENT saw urgent care they have tried prednisone  tried antibiotics she does not feel she is getting better she also relates that ENT told her to take omeprazole she really did not see that that has helped   Review of Systems     Objective:   Physical Exam General-in no acute distress Eyes-no discharge Lungs-respiratory rate normal, CTA CV-no murmurs,RRR Extremities skin warm dry no edema Neuro grossly normal Behavior normal, alert  Small sebaceous/cyst right tonsil benign      Assessment & Plan:  Probable acute rhinosinusitis recently reasonable to go ahead with a round of antibiotics The hoarseness we will try different PPI but if that does not improve things her ENT doctor recommended a sinus CT scan therefore patient would be referred back to ENT for that evaluation No need to do COVID flu or strep testing Lab work was ordered because of the chronicity of the symptoms

## 2024-08-07 ENCOUNTER — Ambulatory Visit: Payer: Self-pay | Admitting: Family Medicine

## 2024-08-07 LAB — CBC WITH DIFFERENTIAL/PLATELET
Basophils Absolute: 0 x10E3/uL (ref 0.0–0.2)
Basos: 0 %
EOS (ABSOLUTE): 0.2 x10E3/uL (ref 0.0–0.4)
Eos: 2 %
Hematocrit: 37.8 % (ref 34.0–46.6)
Hemoglobin: 12.3 g/dL (ref 11.1–15.9)
Immature Grans (Abs): 0 x10E3/uL (ref 0.0–0.1)
Immature Granulocytes: 0 %
Lymphocytes Absolute: 2 x10E3/uL (ref 0.7–3.1)
Lymphs: 20 %
MCH: 32.6 pg (ref 26.6–33.0)
MCHC: 32.5 g/dL (ref 31.5–35.7)
MCV: 100 fL — ABNORMAL HIGH (ref 79–97)
Monocytes Absolute: 0.8 x10E3/uL (ref 0.1–0.9)
Monocytes: 8 %
Neutrophils Absolute: 6.7 x10E3/uL (ref 1.4–7.0)
Neutrophils: 70 %
Platelets: 283 x10E3/uL (ref 150–450)
RBC: 3.77 x10E6/uL (ref 3.77–5.28)
RDW: 12.3 % (ref 11.7–15.4)
WBC: 9.8 x10E3/uL (ref 3.4–10.8)

## 2024-08-07 LAB — BASIC METABOLIC PANEL WITH GFR
BUN/Creatinine Ratio: 15 (ref 12–28)
BUN: 13 mg/dL (ref 8–27)
CO2: 21 mmol/L (ref 20–29)
Calcium: 9.4 mg/dL (ref 8.7–10.3)
Chloride: 103 mmol/L (ref 96–106)
Creatinine, Ser: 0.84 mg/dL (ref 0.57–1.00)
Glucose: 69 mg/dL — ABNORMAL LOW (ref 70–99)
Potassium: 3.7 mmol/L (ref 3.5–5.2)
Sodium: 141 mmol/L (ref 134–144)
eGFR: 79 mL/min/1.73 (ref >59)

## 2024-08-07 LAB — HEPATIC FUNCTION PANEL
ALT: 16 IU/L (ref 0–32)
AST: 17 IU/L (ref 0–40)
Albumin: 4.4 g/dL (ref 3.9–4.9)
Alkaline Phosphatase: 69 IU/L (ref 49–135)
Bilirubin Total: 0.9 mg/dL (ref 0.0–1.2)
Bilirubin, Direct: 0.25 mg/dL (ref 0.00–0.40)
Total Protein: 7 g/dL (ref 6.0–8.5)

## 2024-08-07 LAB — T3, FREE: T3, Free: 3.1 pg/mL (ref 2.0–4.4)

## 2024-08-07 LAB — LIPID PANEL
Chol/HDL Ratio: 3.2 ratio (ref 0.0–4.4)
Cholesterol, Total: 158 mg/dL (ref 100–199)
HDL: 50 mg/dL (ref >39)
LDL Chol Calc (NIH): 87 mg/dL (ref 0–99)
Triglycerides: 116 mg/dL (ref 0–149)
VLDL Cholesterol Cal: 21 mg/dL (ref 5–40)

## 2024-08-07 LAB — T4, FREE: Free T4: 1.17 ng/dL (ref 0.82–1.77)

## 2024-08-07 LAB — TSH: TSH: 2.42 u[IU]/mL (ref 0.450–4.500)

## 2024-08-08 ENCOUNTER — Other Ambulatory Visit: Payer: Self-pay

## 2024-08-08 DIAGNOSIS — D7589 Other specified diseases of blood and blood-forming organs: Secondary | ICD-10-CM

## 2024-08-10 ENCOUNTER — Encounter: Payer: Self-pay | Admitting: "Endocrinology

## 2024-08-10 ENCOUNTER — Ambulatory Visit: Payer: Commercial Managed Care - PPO | Admitting: "Endocrinology

## 2024-08-10 VITALS — BP 123/75 | HR 84 | Resp 18 | Ht 66.0 in | Wt 162.0 lb

## 2024-08-10 DIAGNOSIS — E042 Nontoxic multinodular goiter: Secondary | ICD-10-CM | POA: Diagnosis not present

## 2024-08-10 MED ORDER — ROSUVASTATIN CALCIUM 10 MG PO TABS: 10.0000 mg | ORAL_TABLET | Freq: Every day | ORAL | 3 refills | Status: AC

## 2024-08-10 NOTE — Progress Notes (Signed)
 "                                                     08/10/2024, 11:30 AM  Endocrinology follow-up note   Subjective:    Patient ID: Madison Reyes, female    DOB: 07-16-1961, PCP Alphonsa Glendia LABOR, MD   Past Medical History:  Diagnosis Date   Anxiety    Fibroids    h/o   H/O dysmenorrhea    H/O menorrhagia    High cholesterol    Menopausal symptoms    h/o   Migraine    Past Surgical History:  Procedure Laterality Date   ABDOMINAL HYSTERECTOMY     BACK SURGERY  2015   disc removal   LIPOMA EXCISION     MYOMECTOMY     ovarian fibroid     REDUCTION MAMMAPLASTY Bilateral    2022   TOTAL HIP ARTHROPLASTY Left 01/2021   UTERINE FIBROID SURGERY     Social History   Socioeconomic History   Marital status: Married    Spouse name: Not on file   Number of children: 1   Years of education: Not on file   Highest education level: Bachelor's degree (e.g., BA, AB, BS)  Occupational History    Employer: [employer]  Tobacco Use   Smoking status: Never   Smokeless tobacco: Never  Substance and Sexual Activity   Alcohol use: Never   Drug use: Never   Sexual activity: Yes    Birth control/protection: None    Comment: no cycles; pt had hysterectomy  Other Topics Concern   Not on file  Social History Narrative   Lives at home with husband Ricky   Caffeine: none   Social Drivers of Health   Tobacco Use: Low Risk (08/10/2024)   Patient History    Smoking Tobacco Use: Never    Smokeless Tobacco Use: Never    Passive Exposure: Not on file  Financial Resource Strain: Not on file  Food Insecurity: Low Risk (08/07/2024)   Received from [facility]   Epic    Within the past 12 months, you worried that your food would run out before you got money to buy more: Never true    Within the past 12 months, the food you bought just didn't last and you didn't have money to get more. : Never true  Transportation Needs: No Transportation Needs (08/07/2024)    Received from [facility]    In the past 12 months, has lack of reliable transportation kept you from medical appointments, meetings, work or from getting things needed for daily living? : No  Physical Activity: Not on file  Stress: Not on file  Social Connections: Not on file  Depression (PHQ2-9): Low Risk (08/06/2024)   Depression (PHQ2-9)    PHQ-2 Score: 0  Alcohol Screen: Not on file  Housing: Low Risk (08/07/2024)   Received from [facility]   Epic    What is your living situation today?: I have a steady place to live    Think about the place you live. Do you have problems with any of the following? Choose all that apply:: None/None on this list  Utilities: Low Risk (08/07/2024)   Received from [facility]   Utilities    In the past 12 months has the  electric, gas, oil, or water company threatened to shut off services in your home? : No  Health Literacy: Not on file   Family History  Problem Relation Age of Onset   Stroke Paternal Grandfather    Pneumonia Paternal Grandmother    Cancer Father        bone marrow cancer   Prostate cancer Father    COPD Father    Heart disease Father    Dementia Mother    Hypertension Mother    Heart disease Mother    Cancer Sister        uterine   Breast cancer Neg Hx    Outpatient Encounter Medications as of 08/10/2024  Medication Sig   amoxicillin -clavulanate (AUGMENTIN ) 875-125 MG tablet Take 1 tablet by mouth 2 (two) times daily.   buPROPion  (WELLBUTRIN  XL) 300 MG 24 hr tablet TAKE 1 TABLET DAILY (NEEDS FOLLOW UP OFFICE VISIT BY MAY 2025)   escitalopram  (LEXAPRO ) 10 MG tablet TAKE 1 TABLET DAILY   fluticasone  (FLONASE ) 50 MCG/ACT nasal spray Place 2 sprays into both nostrils daily.   Multiple Vitamins-Minerals (MULTIVITAMIN PO) Take by mouth.   pantoprazole  (PROTONIX ) 40 MG tablet Take 1 tablet (40 mg total) by mouth daily.   [DISCONTINUED] rosuvastatin  (CRESTOR ) 10 MG tablet TAKE 1 TABLET DAILY    rosuvastatin  (CRESTOR ) 10 MG tablet Take 1 tablet (10 mg total) by mouth daily.   No facility-administered encounter medications on file as of 08/10/2024.   ALLERGIES: Allergies  Allergen Reactions   Aspirin     stomach sensitivity not a true allergy.  Cannot tolerate naproxen     VACCINATION STATUS: Immunization History  Administered Date(s) Administered   Moderna Sars-Covid-2 Vaccination 02/01/2020, 03/03/2020    HPI Madison Reyes is 63 y.o. female who presents today with a medical history as above. she is being seen in follow-up after she was seen in consultation for history of hypothyroidism requested by Alphonsa Glendia LABOR, MD.   Due to the fact that her thyroid  function tests with consistent with euthyroid state, she was kept off of thyroid  hormone supplements or replacement over more than 2 years.   She was treated in the past with low-dose Armour Thyroid .  -She denies palpitations, tremors, heat/cold intolerance.  She has no new complaints. She presents with intentional weight loss at last visit.  She remains on Crestor  10 mg p.o. nightly management of dyslipidemia.   She underwent thyroid  ultrasound in December 2023 which is  unremarkable.   Review of Systems  Constitutional: +weight gain, + fatigue, no subjective hyperthermia, no subjective hypothermia   Objective:       08/10/2024   10:45 AM 08/06/2024   11:50 AM 08/06/2024   11:25 AM  Vitals with BMI  Height 5' 6  5' 7  Weight 162 lbs  164 lbs 3 oz  BMI 26.16  25.71  Systolic 123 124 98  Diastolic 75 70 56  Pulse 84      BP 123/75   Pulse 84   Resp 18   Ht 5' 6 (1.676 m)   Wt 162 lb (73.5 kg)   SpO2 99%   BMI 26.15 kg/m   Wt Readings from Last 3 Encounters:  08/10/24 162 lb (73.5 kg)  08/06/24 164 lb 3.2 oz (74.5 kg)  01/12/24 185 lb (83.9 kg)    Physical Exam   Constitutional:  Body mass index is 26.15 kg/m.,  not in acute distress, normal state of mind Eyes: PERRLA, EOMI, no  exophthalmos ENT:  moist mucous membranes, + gross thyromegaly, no gross cervical lymphadenopathy  Thyroid  ultrasound from Jan 06, 2021: Right lobe 5.2 cm, left lobe 5.1 cm.  1.2 cm nodule in the left mid lobe.  No suspicious features, meets criteria for continued surveillance.  CMP ( most recent) CMP     Component Value Date/Time   NA 141 08/06/2024 1219   K 3.7 08/06/2024 1219   CL 103 08/06/2024 1219   CO2 21 08/06/2024 1219   GLUCOSE 69 (L) 08/06/2024 1219   GLUCOSE 88 04/03/2013 0740   BUN 13 08/06/2024 1219   CREATININE 0.84 08/06/2024 1219   CREATININE 0.85 04/03/2013 0740   CALCIUM  9.4 08/06/2024 1219   PROT 7.0 08/06/2024 1219   ALBUMIN 4.4 08/06/2024 1219   AST 17 08/06/2024 1219   ALT 16 08/06/2024 1219   ALKPHOS 69 08/06/2024 1219   BILITOT 0.9 08/06/2024 1219   GFRNONAA 82 05/23/2020 0852   GFRAA 81 08/27/2023 1116     Diabetic Labs (most recent): Lab Results  Component Value Date   HGBA1C 5.0 08/05/2023   HGBA1C 5.1 01/22/2021   HGBA1C 5.1% 11/19/2017     Lipid Panel ( most recent) Lipid Panel     Component Value Date/Time   CHOL 158 08/06/2024 1211   TRIG 116 08/06/2024 1211   HDL 50 08/06/2024 1211   CHOLHDL 3.2 08/06/2024 1211   CHOLHDL 4.3 04/03/2013 0740   VLDL 24 04/03/2013 0740   LDLCALC 87 08/06/2024 1211   LABVLDL 21 08/06/2024 1211     Recent Results (from the past 2160 hours)  T4, Free     Status: None   Collection Time: 08/06/24 12:11 PM  Result Value Ref Range   Free T4 1.17 0.82 - 1.77 ng/dL  T3, Free     Status: None   Collection Time: 08/06/24 12:11 PM  Result Value Ref Range   T3, Free 3.1 2.0 - 4.4 pg/mL  TSH     Status: None   Collection Time: 08/06/24 12:11 PM  Result Value Ref Range   TSH 2.420 0.450 - 4.500 uIU/mL  Lipid Panel     Status: None   Collection Time: 08/06/24 12:11 PM  Result Value Ref Range   Cholesterol, Total 158 100 - 199 mg/dL   Triglycerides 883 0 - 149 mg/dL   HDL 50 >60 mg/dL   VLDL  Cholesterol Cal 21 5 - 40 mg/dL   LDL Chol Calc (NIH) 87 0 - 99 mg/dL   Chol/HDL Ratio 3.2 0.0 - 4.4 ratio    Comment:                                   T. Chol/HDL Ratio                                             Men  Women                               1/2 Avg.Risk  3.4    3.3                                   Avg.Risk  5.0  4.4                                2X Avg.Risk  9.6    7.1                                3X Avg.Risk 23.4   11.0   Specimen status report     Status: None   Collection Time: 08/06/24 12:11 PM  Result Value Ref Range   specimen status report Comment     Comment: Verbal Order See below: Comment: Please provide requested information and fax to [phone] or [phone]. The [place]  Code of Federal Regulations requires a written and signed request be forwarded to a laboratory following a verbal order of a laboratory test.  Please assist us  to meet this requirement and to complete our records. Date:______________________________ Diagnosis code(s) provided for this order:    D75.89     E04.2      E78.2      E03.9 Additional Diagnosis Code(s):____________________________ Please Print ICD-9/10 Diagnosis Code(s):___________________________________________ Physician or Authorized Designee:_____________________________________                                               Please Print Physician or Authorized Designee Signature: ______________________________________________________________________ Your Technical Brewer Your Order Of The Test(s) Listed   Vitamin B12     Status: None   Collection Time: 08/06/24 12:11 PM  Result Value Ref Range   Vitamin B-12 912 232 - 1,245 pg/mL  Specimen status report     Status: None (Preliminary result)   Collection Time: 08/06/24 12:11 PM  Result Value Ref Range   specimen status report Comment     Comment: Written Authorization Written Authorization   CBC with Differential/Platelet     Status: Abnormal    Collection Time: 08/06/24 12:19 PM  Result Value Ref Range   WBC 9.8 3.4 - 10.8 x10E3/uL   RBC 3.77 3.77 - 5.28 x10E6/uL   Hemoglobin 12.3 11.1 - 15.9 g/dL   Hematocrit 62.1 65.9 - 46.6 %   MCV 100 (H) 79 - 97 fL   MCH 32.6 26.6 - 33.0 pg   MCHC 32.5 31.5 - 35.7 g/dL   RDW 87.6 88.2 - 84.5 %   Platelets 283 150 - 450 x10E3/uL   Neutrophils 70 Not Estab. %   Lymphs 20 Not Estab. %   Monocytes 8 Not Estab. %   Eos 2 Not Estab. %   Basos 0 Not Estab. %   Neutrophils Absolute 6.7 1.4 - 7.0 x10E3/uL   Lymphocytes Absolute 2.0 0.7 - 3.1 x10E3/uL   Monocytes Absolute 0.8 0.1 - 0.9 x10E3/uL   EOS (ABSOLUTE) 0.2 0.0 - 0.4 x10E3/uL   Basophils Absolute 0.0 0.0 - 0.2 x10E3/uL   Immature Granulocytes 0 Not Estab. %   Immature Grans (Abs) 0.0 0.0 - 0.1 x10E3/uL  Basic metabolic panel with GFR     Status: Abnormal   Collection Time: 08/06/24 12:19 PM  Result Value Ref Range   Glucose 69 (L) 70 - 99 mg/dL   BUN 13 8 - 27 mg/dL   Creatinine, Ser 9.15 0.57 - 1.00 mg/dL   eGFR 79 >40 fO/fpw/8.26   BUN/Creatinine Ratio 15 12 - 28   Sodium  141 134 - 144 mmol/L   Potassium 3.7 3.5 - 5.2 mmol/L   Chloride 103 96 - 106 mmol/L   CO2 21 20 - 29 mmol/L   Calcium  9.4 8.7 - 10.3 mg/dL  Hepatic function panel     Status: None   Collection Time: 08/06/24 12:19 PM  Result Value Ref Range   Total Protein 7.0 6.0 - 8.5 g/dL   Albumin 4.4 3.9 - 4.9 g/dL   Bilirubin Total 0.9 0.0 - 1.2 mg/dL   Bilirubin, Direct 9.74 0.00 - 0.40 mg/dL   Alkaline Phosphatase 69 49 - 135 IU/L   AST 17 0 - 40 IU/L   ALT 16 0 - 32 IU/L    Jan 11, 2020 thyroid  ultrasound: Right lobe measured 5.6 cm x 1.6 cm x 1.4 cm with no nodules Left lobe measures 6.4 cm x 1.9 cm x 1.9 cm with 1 cm nonsuspicious nodule in the mid section.   Thyroid  ultrasound on 07/30/2022  IMPRESSION: 1. Previously described left mid thyroid  nodule appears spongiform on current examination and does not require further imaging follow-up. 2.  Additional subcentimeter left mid thyroid  nodule does not meet criteria for FNA or surveillance.  Assessment & Plan:   1.  Nodular goiter  2.  Dyslipidemia 3.  Vitamin D  deficiency: She is advised to continue vitamin D3 2000 units p.o. daily. -I discussed her recent labs and thyroid  ultrasound findings with her.  She maintains euthyroid state off of all thyroid  hormone supplements.   She will not need intervention for thyroid  hormone or antithyroid medications.    Prior to her last visit thyroid  ultrasound to monitor the previously documented small nonsuspicious thyroid  nodule .  She presents with intentional weight loss.  She has engaged partially to left amides recommendations.    She has a remarkable improvement in her dyslipidemia on Crestor  10 mg p.o. nightly.  She is advised to continue.     - she is advised to maintain close follow up with Alphonsa Glendia LABOR, MD for primary care needs.   I spent  20  minutes in the care of the patient today including review of labs from Thyroid  Function, CMP, and other relevant labs ; imaging/biopsy records (current and previous including abstractions from other facilities); face-to-face time discussing  her lab results and symptoms, medications doses, her options of short and long term treatment based on the latest standards of care / guidelines;   and documenting the encounter.  Madison Reyes  participated in the discussions, expressed understanding, and voiced agreement with the above plans.  All questions were answered to her satisfaction. she is encouraged to contact clinic should she have any questions or concerns prior to her return visit.    Follow up plan: Return in about 1 year (around 08/10/2025) for Fasting Labs  in AM B4 8, A1c -[place].   Ranny Earl, MD [facility] [address] [place], [place] [address] Phone: [phone]  Fax: [phone]     08/10/2024, 11:30 AM  This  note was partially dictated with voice recognition software. Similar sounding words can be transcribed inadequately or may not  be corrected upon review.  "

## 2024-08-22 LAB — SPECIMEN STATUS REPORT

## 2024-08-30 ENCOUNTER — Other Ambulatory Visit: Payer: Self-pay | Admitting: Family Medicine

## 2024-08-30 ENCOUNTER — Ambulatory Visit: Payer: Self-pay | Admitting: Family Medicine

## 2024-08-30 ENCOUNTER — Telehealth: Payer: Self-pay

## 2024-08-30 DIAGNOSIS — E559 Vitamin D deficiency, unspecified: Secondary | ICD-10-CM

## 2024-08-30 LAB — SPECIMEN STATUS REPORT

## 2024-08-30 LAB — VITAMIN B12: Vitamin B-12: 912 pg/mL (ref 232–1245)

## 2024-08-30 NOTE — Telephone Encounter (Signed)
 Copied from CRM [id number]. Topic: Clinical - Request for Lab/Test Order >> Aug 30, 2024 10:08 AM Hadassah PARAS wrote: Reason for CRM: Pt is requesting to check Vitamin d  level. Please advise pt on #[id number]

## 2024-08-30 NOTE — Telephone Encounter (Signed)
 Lab test was ordered she can do it at her convenience [facility]

## 2024-09-09 ENCOUNTER — Other Ambulatory Visit: Payer: Self-pay | Admitting: Family Medicine

## 2024-09-11 ENCOUNTER — Ambulatory Visit: Payer: Self-pay | Admitting: Family Medicine

## 2024-09-11 LAB — VITAMIN D 25 HYDROXY (VIT D DEFICIENCY, FRACTURES): Vit D, 25-Hydroxy: 39.4 ng/mL (ref 30.0–100.0)

## 2024-09-17 ENCOUNTER — Ambulatory Visit: Admitting: Family Medicine

## 2025-08-12 ENCOUNTER — Ambulatory Visit: Admitting: "Endocrinology
